# Patient Record
Sex: Female | Born: 1960 | Race: White | Hispanic: No | Marital: Married | Smoking: Never smoker
Health system: Southern US, Community
[De-identification: ages and names within clinical notes are randomized; demographics above are authoritative.]

## PROBLEM LIST (undated history)

## (undated) DIAGNOSIS — C439 Malignant melanoma of skin, unspecified: Secondary | ICD-10-CM

## (undated) DIAGNOSIS — Z460 Encounter for fitting and adjustment of spectacles and contact lenses: Secondary | ICD-10-CM

## (undated) HISTORY — DX: Malignant melanoma of skin, unspecified: C43.9

---

## 2007-03-03 HISTORY — PX: ABDOMINAL HYSTERECTOMY: SHX81

## 2007-03-15 ENCOUNTER — Encounter (INDEPENDENT_AMBULATORY_CARE_PROVIDER_SITE_OTHER): Payer: Self-pay | Admitting: Obstetrics & Gynecology

## 2007-03-16 ENCOUNTER — Inpatient Hospital Stay (HOSPITAL_COMMUNITY): Admission: AD | Admit: 2007-03-16 | Discharge: 2007-03-17 | Payer: Self-pay | Admitting: Obstetrics & Gynecology

## 2010-07-15 NOTE — Op Note (Signed)
[name], [name] NO.:  [id number]   MEDICAL RECORD NO.:  [id number]          PATIENT TYPE:  AMB   LOCATION:  DAY                          FACILITY:  [facility]   PHYSICIAN:  Genia Del, M.D.DATE OF BIRTH:  02-03-1961   DATE OF PROCEDURE:  03/15/2007  DATE OF DISCHARGE:                               OPERATIVE REPORT   PREOPERATIVE DIAGNOSIS:  Large symptomatic uterine myomas with  menorrhagia and anemia.   POSTOPERATIVE DIAGNOSIS:  Large symptomatic uterine myomas with  menorrhagia and anemia, bilateral paratubal cysts.   PROCEDURE:  Total laparoscopic hysterectomy and excision of paratubal  cysts bilaterally assisted with Davinci robot.   SURGEON:  Genia Del, M.D.   ASSISTANT:  Lendon Colonel, M.D.  Lenoard Aden, M.D.   ANESTHESIOLOGIST:  Jenelle Mages. Rica Mast, M.D.   PROCEDURE IN DETAIL:  Under general anesthesia with endotracheal  intubation, the patient is in lithotomy position.  She is prepped with  Betadine on the abdominal, suprapubic, vulvar, and vaginal areas and  draped as usual.  The vaginal exam reveals an anteverted uterus,  irregular, increased in volume, corresponding to about 14 cm.  No  adnexal masses felt.  We put the Coring with the Rumi in place as usual  without difficulty. A Rumi 10 cm is used.  We then go abdominally.  A  supraumbilical incision is made over 1.5 cm with a scalpel.  The  aponeurosis is opened under direct vision with Mayo scissors and the  parietal peritoneum is opened bluntly with a finger.  We put a  pursestring stitch of Vicryl 0 at the aponeurosis.  We insert the Ross  under direct vision.  We created pneumoperitoneum with CO2 and put the  camera at that level.  We inspect the abdominal pelvic cavities.  No  adhesion is present.  The uterus is large with multiple fibroids.  We  then use a W configuration for the robotic and assistant ports.  We put  them in under direct vision.  We then dock  the robot without difficulty  and insert the instruments which are the Endoshear scissors in the right  arm, the fenestrated bipolar in the left arm, and the third robotic arm  has a Cobra. We then go to the console and start the procedure.  We  cauterize and section the left round ligament, then the left tube and  left utero-ovarian ligament.  We then follow along the left uterine side  and stop just before the uterine artery.  We visualized the ureter on  the left side and it is in normal anatomic position.  We then opened the  visceral peritoneum anteriorly and reclined the bladder.  We proceed  exactly the same way on the right side.  Note, that the right ureter was  close to infundibulopelvic ligament, very superficially at the pelvic  brim. We then go on the left side and skeletonize the left uterine  artery, cauterized it, and sectioned it.  We proceed the same way on the  right side.  The uterus is blanching well.  We then further recline the  bladder downward.  We pushed the Coring further in and opened the vagina  anteriorly over the Coring with the tip of the scissors.  We continue  circumferentially until we reached each lateral side of the uterus.  We  go posteriorly to open the vagina over the Coring and then complete by  reaching the lateral right and then lateral left side.  The uterus is  completely detached.  Hemostasis is adequate at all levels.  We removed  the coring and the Rumi. We put the occluder and inflate it in the  vagina.  We put the uterus with all the fibroids towards the left  gutter. We changed the instruments to a cutting needle driver in the  right hand and normal needle driver in the left hand.  We use Vicryl 0  to close the vagina with figure-of-eights, seven of them are needed to  close the vagina completely and firmly. Hemostasis is adequate at all  levels.  We then removed two very small paratubal cyst on the right  side.  Those are discarded and we  removed a left paratubal cyst with the  distal parts of the left tube which had the hydrosalpinx. That specimen  is sent to pathology.  Hemostasis is adequate on the adnexa.  We then  remove all instruments.  We undocked the robot and continue  laparoscopically.  We removed the assistant port and put the morcellator  at that level.  We morcellate the uterus easily and send the specimen to  pathology.  The weight is 560 grams.  We then irrigate and suction the  abdominopelvic cavity.  We inspect carefully. Hemostasis is adequate at  all levels.  We remove all instruments and remove the trocars under  direct vision.  We evacuate the CO2.  The count of sponges and  instruments was complete x2.  We then close the pursestring stitch at  the supraumbilical incision by attaching it.  We then put a subcuticular  stitch of Vicryl 4-0 on all incisions and add Dermabond on top.  Hemostasis is adequate on all incisions.  The occluder was removed from  the vagina.  The patient had received a dose of Ancef 1 gram IV at  induction.  The estimated blood loss was 100 mL.  No complications  occurred and the patient was brought to the recovery room in good stable  status.      Genia Del, M.D.  Electronically Signed     ML/MEDQ  D:  03/15/2007  T:  03/15/2007  Job:  [id number]

## 2010-11-20 LAB — CBC
Hemoglobin: 9 — ABNORMAL LOW
MCHC: 32
MCV: 71.3 — ABNORMAL LOW
RBC: 3.79 — ABNORMAL LOW
RBC: 4.52
WBC: 15.5 — ABNORMAL HIGH
WBC: 6.3

## 2010-11-20 LAB — PREGNANCY, URINE

## 2012-03-02 HISTORY — PX: MELANOMA EXCISION: SHX5266

## 2012-08-31 ENCOUNTER — Other Ambulatory Visit: Payer: Self-pay | Admitting: Dermatology

## 2012-09-21 ENCOUNTER — Ambulatory Visit (INDEPENDENT_AMBULATORY_CARE_PROVIDER_SITE_OTHER): Payer: BC Managed Care – PPO | Admitting: General Surgery

## 2012-09-21 ENCOUNTER — Encounter (INDEPENDENT_AMBULATORY_CARE_PROVIDER_SITE_OTHER): Payer: Self-pay | Admitting: General Surgery

## 2012-09-21 VITALS — BP 108/78 | HR 53 | Temp 98.5°F | Ht 65.0 in | Wt 119.8 lb

## 2012-09-21 DIAGNOSIS — C4359 Malignant melanoma of other part of trunk: Secondary | ICD-10-CM | POA: Insufficient documentation

## 2012-09-21 NOTE — Patient Instructions (Signed)
Melanoma Melanoma is the least common, but most dangerous, form of skin cancer. This is because it can spread (metastasize) to other organs and can be life-threatening. Melanoma is a cancerous (malignant) tumor that begins in a certain type of cells, called melanocytes. Melanocytes are the cells that produce the color (pigment) called melanin. Melanin colors our skin, hair, eyes, and moles. CAUSES  The exact cause of melanoma is unknown. You may have a higher risk if you:  Spend or have spent a lot of time in the sun. This includes sunlamp and tanning booth exposure.  Have had sunburns. This put you at a particularly increased risk for melanoma. The more blistering sunburns a person has, the higher the risk.  Spend time in parts of the world with more intense sunlight.  Have fair skin that does not tan easily. You may have a lower risk if you have a darker skin color. However, people with darker skin can get melanoma, especially on the hands and feet (acral areas).  Have a close relative (parent, sibling) who has melanoma.  Have a large number of skin moles (more than 100). SYMPTOMS  A skin mole is suspicious if it has any of these 5 traits. This is called the ABCDE's of melanoma:  Asymmetry: Irregular shape, not simply round or oval.  Border: Edge of the mole is irregular, not smooth.  Color: Mole may have multiple colors in it, including brown, black, blue, red, or tan.  Diameter: More than 0.2 inches (6 mm) across.  Evolving: Any unusual change or symptoms in the mole, such as pain, itching, stinging, sensitivity, or bleeding. A mole that is noticeably changing in appearance, or any new mole, should be checked for melanoma. In general, people develop new moles until age 30. New moles after this age should be brought to the attention of your caregiver. DIAGNOSIS  Your caregiver can look at your skin and find lesions or moles that may be suspicious. A patient may also notice a mole  with symptoms or a mole that does not look like most of the other moles on his or her body. This is called the "ugly duckling" sign. A tissue sample (biopsy) examined under a microscope is needed to determine if it is melanoma. The size and extent of the biopsy will depend on the location, size, and appearance of the skin lesion or mole. The biopsy can also reveal whether melanoma has spread to deeper layers of the skin. TREATMENT  Surgery to completely remove the melanoma is required. Lymph nodes may also be removed. If the melanoma has spread to other organs, such as the liver, lungs, bone, or brain, cancer-fighting drugs (chemotherapy) must be used. Your caregiver will discuss your treatment options with you. You can ask about being included in a clinical trial to evaluate new forms of treatment. Melanoma can occasionally recur years after the initial diagnosis. If you have melanoma, you will need follow-up visits with your caregiver for many years. PREVENTION  Risk for melanoma can be reduced by minimizing sun exposure. Practice the 3 S's:  Slip on a shirt.  Slop on sunscreen.  Slap on a hat. Do not spend time in the sun during peak midafternoon hours. Sunscreen/sunblock with SPF 30 or higher and UVA/UVB block should be applied regularly. You should do this even during brief exposure to sunlight. You should also do this on cloudy days and in winter, even though the perceived sunlight is less. Always avoid sunburn! Wear sunglasses that block UV   light. Be sure to see your caregiver if you have any new or changing moles. HOME CARE INSTRUCTIONS   Follow wound care instructions after surgical removal of your melanoma.  Practice good sun avoidance and protective measures as described above.  Let your close family members (parents, children, siblings) know about your diagnosis. This puts them at a higher risk of getting melanoma than the general population. SEEK MEDICAL CARE IF:   You notice any  new moles, or you have any moles that are changing.  You have had a melanoma removed and you notice a new growth near the same location.  You have had a melanoma removed and you experience any new or unexplained health problems. Document Released: 02/16/2005 Document Revised: 05/11/2011 Document Reviewed: 06/07/2009 ExitCare Patient Information 2014 [employer].  

## 2012-09-21 NOTE — Progress Notes (Signed)
Patient ID: [name], female   DOB: February 08, 1961, 52 y.o.   MRN: [id number]  Chief Complaint  Patient presents with  . New Evaluation    eval melanoma of abd    HPI [name] is a 52 y.o. female.   HPI 52 year old Caucasian female referred by Dr. Arminda Resides for evaluation of a malignant melanoma involving the left upper abdominal wall. The patient states that she has had a small scar or 4 lesions were scabbed in that area ever since her laparoscopic assisted hysterectomy in 2009. She states that the area would scab over, peel-off and form a new scab. However about 4 months ago she states that the area started looking funny. She states that the scab fell off and it never formed a new scab. She states that it became dark, would bleed, and it had a change in size. She states that area also started to grow outward..  She underwent punch biopsy in Dr. Yetta Barre office on July 2  This is her first melanoma diagnosis. She has a history of basal cell carcinoma. There is no family history of melanoma. She states that she did have a fairly typical history of sun exposure including his tanning beds over the years.  History reviewed. No pertinent past medical history.  Past Surgical History  Procedure Laterality Date  . Abdominal hysterectomy  03/2007    partial    Family History  Problem Relation Age of Onset  . Cancer Maternal Grandfather     Liver    Social History History  Substance Use Topics  . Smoking status: Former Games developer  . Smokeless tobacco: Not on file  . Alcohol Use: Yes     Comment: very ra/ 4 times a year    No Known Allergies  No current outpatient prescriptions on file.   No current facility-administered medications for this visit.    Review of Systems Review of Systems  Constitutional: Negative for fever, chills, activity change, fatigue and unexpected weight change.  HENT: Negative for hearing loss, congestion, sore throat, trouble swallowing, neck  pain, voice change and sinus pressure.   Eyes: Negative for photophobia, pain and visual disturbance.  Respiratory: Negative for cough and wheezing.   Cardiovascular: Negative for chest pain, palpitations and leg swelling.  Gastrointestinal: Negative for nausea, vomiting, abdominal pain, diarrhea, constipation and blood in stool.  Genitourinary: Negative for hematuria, vaginal bleeding and difficulty urinating.  Musculoskeletal: Negative for arthralgias.  Skin: Negative for rash and wound.  Neurological: Negative for seizures, syncope, facial asymmetry, speech difficulty, weakness, numbness and headaches.       Remote h/o vertigo  Hematological: Negative for adenopathy. Does not bruise/bleed easily.  Psychiatric/Behavioral: Negative for confusion.    Blood pressure 108/78, pulse 53, temperature 98.5 F (36.9 C), temperature source Temporal, height 5\' 5"  (1.651 m), weight 119 lb 12.8 oz (54.341 kg), SpO2 98.00%.  Physical Exam Physical Exam  Vitals reviewed. Constitutional: She is oriented to person, place, and time. She appears well-developed and well-nourished. No distress.  HENT:  Head: Normocephalic and atraumatic.  Right Ear: External ear normal.  Left Ear: External ear normal.  Eyes: Conjunctivae are normal. No scleral icterus.  Neck: Normal range of motion. Neck supple. No tracheal deviation present. No thyromegaly present.  Cardiovascular: Normal rate and normal heart sounds.   Pulmonary/Chest: Effort normal and breath sounds normal. No stridor. No respiratory distress. She has no wheezes.  Abdominal: Soft. There is no tenderness. There is no rebound and no guarding.  1cm almost healed incision  Musculoskeletal: She exhibits no edema and no tenderness.  Lymphadenopathy:       Head (right side): No submental, no submandibular, no preauricular and no posterior auricular adenopathy present.       Head (left side): No submental, no submandibular, no preauricular and no  posterior auricular adenopathy present.    She has no cervical adenopathy.    She has no axillary adenopathy.       Right: No inguinal and no supraclavicular adenopathy present.       Left: No inguinal and no supraclavicular adenopathy present.  Neurological: She is alert and oriented to person, place, and time. She exhibits normal muscle tone.  Skin: Skin is warm and dry. No rash noted. She is not diaphoretic. No erythema.  Psychiatric: She has a normal mood and affect. Her behavior is normal. Judgment and thought content normal.      Data Reviewed Dr Yetta Barre office note - 11mm Crusted erythematous and hyperpigmented papule located on left upper abdomen. 6 mm punch biopsy was performed with placement of 4 5-0 nylon sutures  Pathology report-malignant melanoma, Clark's level IV, Breslow's measurement 2.75 mm, 12 mitoses per millimeter square, regression absent, ulceration present, satellitosis absent, vascular invasion not identified, margins involved; T3 B. 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 We discussed its evaluation and management. The patient was given educational material. She is accompanied by her husband.  We first discussed management of the primary lesion in her abdominal wall. I explained the need to do a wide local excision. We discussed the need to do 2 cm margins. We discussed that this would involve making a larger incision in order to close the skin.   We then discussed addressing potential lymph node involvement. We discussed sentinel lymph node biopsy. We discussed the manner and how it is performed. We discussed lymph node mapping preoperatively so we would know where to perform the sentinel lymph node biopsy. We discussed that while sentinel lymph node biopsy has not shown any improvement with respect to survival it does help with staging disease. We  discussed the risk and benefits of the procedure.   With respect to wide local excision of abdominal wall melanoma as well as sentinel lymph node biopsy we discussed the risk and benefits of surgery including but not limited to bleeding, infection, injury to surrounding structures, seroma, hematoma formation, blood clot formation, anesthesia concerns, cosmesis outcomes, lymph leak, lymphocele. We discussed most common issue is some type of wound healing problem. We discussed the typical postoperative course. We briefly discussed the potential for a full lymph node dissection should the sentinel lymph node be positive. However I told the patient I do not send the sentinel lymph node off during surgery.  She states that she is scheduled to followup her dermatologist to do a full body skin survey which I think is very appropriate.  All of her questions were asked and answered and she would like to proceed with surgery as described above. I instructed her and her husband to contact the office should she have any questions or concerns  Mary Sella. Andrey Campanile, MD, FACS General, Bariatric, & Minimally Invasive Surgery [facility], [place]        [facility] M 09/21/2012, 1:05 PM

## 2012-09-22 ENCOUNTER — Encounter (INDEPENDENT_AMBULATORY_CARE_PROVIDER_SITE_OTHER): Payer: Self-pay | Admitting: General Surgery

## 2012-09-22 ENCOUNTER — Telehealth (INDEPENDENT_AMBULATORY_CARE_PROVIDER_SITE_OTHER): Payer: Self-pay

## 2012-09-22 NOTE — Telephone Encounter (Signed)
Pt  Calling after reading her AVS, she is upset her name was [name]. Sprague and she does not do by that, her name was corrected to [name]. [name].  She states she is also upset because her AVS states she is a former smoker.  Pt states she has never been a "smoker" but did smoke cigarettes with her friends occasionally in her teens/ twenties.  Pt would like the AVS corrected to state she has never been a real "smoker".  I advised patient I would put a note in her chart regarding the smoking issue.  Patient satisfied.

## 2012-09-23 ENCOUNTER — Encounter (HOSPITAL_BASED_OUTPATIENT_CLINIC_OR_DEPARTMENT_OTHER): Payer: Self-pay | Admitting: *Deleted

## 2012-09-23 NOTE — Progress Notes (Signed)
No health problems -to come in for [facility] labs

## 2012-09-26 ENCOUNTER — Encounter (HOSPITAL_COMMUNITY)
Admission: RE | Admit: 2012-09-26 | Discharge: 2012-09-26 | Disposition: A | Discharge status: Home / Self Care | Payer: BC Managed Care – PPO | Source: Ambulatory Visit | Attending: General Surgery | Admitting: General Surgery

## 2012-09-26 DIAGNOSIS — C4359 Malignant melanoma of other part of trunk: Secondary | ICD-10-CM

## 2012-09-26 MED ORDER — TECHNETIUM TC 99M SULFUR COLLOID FILTERED
0.5000 | Freq: Once | INTRAVENOUS | Status: AC | PRN
  Administered 2012-09-26: 0.5 via INTRADERMAL

## 2012-09-27 ENCOUNTER — Telehealth (INDEPENDENT_AMBULATORY_CARE_PROVIDER_SITE_OTHER): Payer: Self-pay | Admitting: General Surgery

## 2012-09-27 NOTE — Telephone Encounter (Signed)
Pt is going to [facility] for her surgery

## 2012-09-29 ENCOUNTER — Encounter (HOSPITAL_COMMUNITY): Payer: BC Managed Care – PPO

## 2012-09-29 ENCOUNTER — Ambulatory Visit (HOSPITAL_BASED_OUTPATIENT_CLINIC_OR_DEPARTMENT_OTHER): Admission: RE | Admit: 2012-09-29 | Payer: BC Managed Care – PPO | Source: Ambulatory Visit | Admitting: General Surgery

## 2012-09-29 HISTORY — DX: Encounter for fitting and adjustment of spectacles and contact lenses: Z46.0

## 2012-09-29 SURGERY — EXCISION, MELANOMA, WITH SENTINEL LYMPH NODE BIOPSY
Anesthesia: General | Site: Abdomen

## 2012-10-20 ENCOUNTER — Encounter (INDEPENDENT_AMBULATORY_CARE_PROVIDER_SITE_OTHER): Payer: BC Managed Care – PPO | Admitting: General Surgery

## 2013-01-05 ENCOUNTER — Other Ambulatory Visit: Payer: Self-pay

## 2013-07-28 ENCOUNTER — Other Ambulatory Visit: Payer: Self-pay | Admitting: Dermatology

## 2013-08-28 ENCOUNTER — Other Ambulatory Visit: Payer: Self-pay | Admitting: Dermatology

## 2013-12-15 ENCOUNTER — Other Ambulatory Visit: Payer: Self-pay

## 2016-01-10 ENCOUNTER — Encounter (HOSPITAL_COMMUNITY): Payer: Self-pay | Admitting: Family Medicine

## 2016-01-10 ENCOUNTER — Ambulatory Visit (HOSPITAL_COMMUNITY)
Admission: EM | Admit: 2016-01-10 | Discharge: 2016-01-10 | Disposition: A | Discharge status: Home / Self Care | Payer: BLUE CROSS/BLUE SHIELD | Attending: Emergency Medicine | Admitting: Emergency Medicine

## 2016-01-10 DIAGNOSIS — R109 Unspecified abdominal pain: Secondary | ICD-10-CM

## 2016-01-10 DIAGNOSIS — R110 Nausea: Secondary | ICD-10-CM

## 2016-01-10 MED ORDER — METRONIDAZOLE 500 MG PO TABS: 500.0000 mg | ORAL_TABLET | Freq: Three times a day (TID) | ORAL | 0 refills | Status: DC

## 2016-01-10 MED ORDER — CIPROFLOXACIN HCL 500 MG PO TABS: 500.0000 mg | ORAL_TABLET | Freq: Two times a day (BID) | ORAL | 0 refills | Status: DC

## 2016-01-10 MED ORDER — ONDANSETRON HCL 4 MG PO TABS: 4.0000 mg | ORAL_TABLET | Freq: Four times a day (QID) | ORAL | 0 refills | Status: DC

## 2016-01-10 NOTE — Discharge Instructions (Signed)
°  Please take the antibiotics as prescribed.   There is a small chance of body aches and tendon injury with ciprofloxacin. If you develop severe muscle and joint pain while taking, please call your primary care provider or our urgent care so your medication can be changed.  While taking flagyl, do not drink alcohol as it will likely cause you to become nauseated and have severe vomiting.  If you develop fever, worsening pain, blood in stool, or unable to keep down fluids, please go to closest emergency department for further evaluation and treatment.  It is very important to still follow up with your primary care provider next week if possible, even if feeling better.

## 2016-01-10 NOTE — ED Provider Notes (Signed)
CSN: [id number]     Arrival date & time 01/10/16  1324 History   First MD Initiated Contact with Patient 01/10/16 1345     Chief Complaint  Patient presents with  . Abdominal Pain   (Consider location/radiation/quality/duration/timing/severity/associated sxs/prior Treatment) HPI [name] is a 55 y.o. female presenting to UC with c/o mild persistent nausea and Left sided abdominal discomfort and aching for about 2 weeks, worse after eating.  Pt notes symptoms started shortly after eating popcorn and drinking a diet soda.  She became nauseated and vomited once but pain and nausea have persisted.  She has tried to eat crackers and soup but everything seems to make the discomfort worse. Denies fever, chills, urinary symptoms or change in bowel movements. Denies diarrhea or blood in stool. No prior hx of diverticulitis and no known family hx of diverticulitis.  She has not had a colonoscopy yet as she knows she should see her PCP more often, however, is overall healthy so she does not go.   Past Medical History:  Diagnosis Date  . Contact lens/glasses fitting    wears glasses or contacts   Past Surgical History:  Procedure Laterality Date  . ABDOMINAL HYSTERECTOMY  03/2007   partial   Family History  Problem Relation Age of Onset  . Cancer Maternal Grandfather     Liver   Social History  Substance Use Topics  . Smoking status: Never Smoker  . Smokeless tobacco: Never Used     Comment: pt has very remote history of social smoking more than 30 yrs ago  . Alcohol use Yes     Comment: very ra/ 4 times a year   OB History    No data available     Review of Systems  Constitutional: Negative for chills and fever.  Gastrointestinal: Positive for abdominal pain, nausea and vomiting. Negative for blood in stool, constipation and diarrhea.  Genitourinary: Negative for dysuria, frequency, hematuria, pelvic pain, urgency, vaginal bleeding, vaginal discharge and vaginal pain.   Musculoskeletal: Negative for back pain and myalgias.    Allergies  Patient has no known allergies.  Home Medications   Prior to Admission medications   Medication Sig Start Date End Date Taking? Authorizing Provider  ciprofloxacin (CIPRO) 500 MG tablet Take 1 tablet (500 mg total) by mouth every 12 (twelve) hours. 01/10/16   Noland Fordyce, PA-C  metroNIDAZOLE (FLAGYL) 500 MG tablet Take 1 tablet (500 mg total) by mouth 3 (three) times daily. One po bid x 7 days 01/10/16   Noland Fordyce, PA-C  ondansetron (ZOFRAN) 4 MG tablet Take 1 tablet (4 mg total) by mouth every 6 (six) hours. 01/10/16   Noland Fordyce, PA-C   Meds Ordered and Administered this Visit  Medications - No data to display  BP 133/82 (BP Location: Left Arm)   Pulse (!) 52 Comment: notified rn  Temp 97.8 F (36.6 C) (Oral)   Resp 16   SpO2 99%  No data found.   Physical Exam  Constitutional: She appears well-developed and well-nourished. No distress.  Pt sitting on exam bed, appears well, NAD. Non-toxic appearing.  HENT:  Head: Normocephalic and atraumatic.  Mouth/Throat: Oropharynx is clear and moist.  Eyes: Conjunctivae are normal. No scleral icterus.  Neck: Normal range of motion. Neck supple.  Cardiovascular: Normal rate, regular rhythm and normal heart sounds.   Pulmonary/Chest: Effort normal and breath sounds normal. No respiratory distress. She has no wheezes. She has no rales.  Abdominal: Soft. Bowel sounds are  normal. She exhibits no distension and no mass. There is tenderness. There is no rebound, no guarding and no CVA tenderness.  Soft, non-distended, minimal tenderness in Left side of abdomen. No rebound, guarding, or masses palpated. No CVA tenderness.   Musculoskeletal: Normal range of motion.  Neurological: She is alert.  Skin: Skin is warm and dry. She is not diaphoretic.  Nursing note and vitals reviewed.   Urgent Care Course   Clinical Course     Procedures (including critical care  time)  Labs Review Labs Reviewed - No data to display  Imaging Review No results found.    MDM   1. Left sided abdominal pain   2. Nausea    Pt c/o persistent Left sided abdominal pain and nausea for about 2 weeks. Mild Left sided abdominal tenderness. Hx and exam c/w diverticulitis, however, pt does not have prior hx of diverticulitis.  Pt is afebrile, non-toxic appearing. Able to keep down fluids. Consulted with Dr. Bridgett Larsson, agrees pt can be treated empirically for diverticulitis, and have pt f/u with PCP and GI to schedule colonoscopy as she is due for one given her age anyway.    Rx: Cipro, flagyl, and zofran. Home care instructions for abdominal pain and diverticulitis. Encouraged to f/u with PCP next week. Discussed symptoms that warrant emergent care in the ED including worsening pain, fever, blood in stool or other new/concerning symptoms develop. Patient verbalized understanding and agreement with treatment plan.     Noland Fordyce, PA-C 01/10/16 1416

## 2016-01-10 NOTE — ED Triage Notes (Signed)
Pt here for left sided abd pain. sts that she ate some popcorn and diet coke 2 weeks ago and had abd pain right after with vomiting and diarrhea. sts none since that first day but lingering and  pain and nausea. sts she hasnt been going to the bathroom a lot but she isn't constipated.no urinary complaints.

## 2016-01-13 ENCOUNTER — Encounter: Payer: Self-pay | Admitting: Gastroenterology

## 2016-03-11 ENCOUNTER — Encounter: Payer: Self-pay | Admitting: Gastroenterology

## 2016-03-11 ENCOUNTER — Ambulatory Visit (INDEPENDENT_AMBULATORY_CARE_PROVIDER_SITE_OTHER): Payer: Self-pay | Admitting: Gastroenterology

## 2016-03-11 VITALS — BP 112/74 | HR 68 | Ht 64.5 in | Wt 114.0 lb

## 2016-03-11 DIAGNOSIS — R1032 Left lower quadrant pain: Secondary | ICD-10-CM

## 2016-03-11 DIAGNOSIS — Z1211 Encounter for screening for malignant neoplasm of colon: Secondary | ICD-10-CM

## 2016-03-11 MED ORDER — NA SULFATE-K SULFATE-MG SULF 17.5-3.13-1.6 GM/177ML PO SOLN: ORAL | 0 refills | Status: DC

## 2016-03-11 NOTE — Progress Notes (Signed)
HPI: This is a very pleasant 56 year old woman  who was referred to me by [facility] M.D.  Chief complaint is left lower abdominal Pain, presumed diverticulitis  She has chronic left lower quadrant discomfort. This is been going on for many many years. She thinks it's worse when she drinks carbonated beverages. About 2 months ago she had worsening of that left lower quadrant pain, pain became significant enough that she sought medical attention at urgent clinic.   She went to urgent clinic November 2017 with left-sided abdominal pains, nausea. She was found to have minimal tenderness in the left side. She was treated empirically for diverticulitis with oral antibiotics Cipro and Flagyl. She did not have any lab testing or imaging studies.  She completed the antibiotic course and  She is back to her nomral. She usually has llq discomfort.  She drinks a lot of diet coke and chocolate, cut those out for 8 weeks.  Reduced the meat and gluten she eats but back.  She is Emergency planning/management officer.  1-2bms daily.  No bleeding  Overall stable weight  No FH of colon cancer.   Review of systems: Pertinent positive and negative review of systems were noted in the above HPI section. Complete review of systems was performed and was otherwise normal.   Past Medical History:  Diagnosis Date  . Contact lens/glasses fitting    wears glasses or contacts  . Melanoma [facility])     Past Surgical History:  Procedure Laterality Date  . ABDOMINAL HYSTERECTOMY  03/2007   partial  . MELANOMA EXCISION  2014   abdominal    No current outpatient prescriptions on file.   No current facility-administered medications for this visit.     Allergies as of 03/11/2016  . (No Known Allergies)    Family History  Problem Relation Age of Onset  . Hypertension Mother   . Liver cancer Maternal Grandfather   . Breast cancer Sister   . Hypertension Brother   . Cancer Maternal Grandmother     type unknown  .  Heart disease Maternal Grandmother   . Heart disease Paternal Grandfather     Social History   Social History  . Marital status: Married    Spouse name: N/A  . Number of children: 2  . Years of education: N/A   Occupational History  . parellegal    Social History Main Topics  . Smoking status: Never Smoker  . Smokeless tobacco: Never Used     Comment: pt has very remote history of social smoking more than 30 yrs ago  . Alcohol use Yes     Comment: very ra/ 4 times a year  . Drug use: No  . Sexual activity: Not on file   Other Topics Concern  . Not on file   Social History Narrative  . No narrative on file     Physical Exam: Ht 5' 4.5" (1.638 m) Comment: height measured without shoes  Wt 114 lb (51.7 kg)   BMI 19.27 kg/m  Constitutional: generally well-appearing Psychiatric: alert and oriented x3 Eyes: extraocular movements intact Mouth: oral pharynx moist, no lesions Neck: supple no lymphadenopathy Cardiovascular: heart regular rate and rhythm Lungs: clear to auscultation bilaterally Abdomen: soft, Very mild left lower quadrant pain, nondistended, no obvious ascites, no peritoneal signs, normal bowel sounds Extremities: no lower extremity edema bilaterally Skin: no lesions on visible extremities   Assessment and plan: 56 y.o. female with  previous presumed left-sided diverticulitis, routine risk for  colon cancer screening  I do think that she probably had mild left-sided colonic diverticulitis back in November. She has recovered and is back to her baseline after a single course of oral antibiotics. No imaging or blood tests were done during her acute illness and so the diagnosis is not completely confident. She is 62 and she has never had colon cancer screening I recommended we proceed with colonoscopy at her soonest convenience. She also knows to call here if she has repeated episodes of left-sided acute abdominal pains. If that is the case I would like to proceed  with blood tests and CT scan abdomen and pelvis to try to confirm her diagnosis.   Owens Loffler, MD [facility] 03/11/2016, 8:41 AM  Cc: Princess Bruins, MD

## 2016-03-11 NOTE — Patient Instructions (Addendum)
You have been scheduled for a colonoscopy. Please follow written instructions given to you at your visit today.  Please pick up your prep supplies at the pharmacy within the next 1-3 days. If you use inhalers (even only as needed), please bring them with you on the day of your procedure. Your physician has requested that you go to www.startemmi.com and enter the access code given to you at your visit today. This web site gives a general overview about your procedure. However, you should still follow specific instructions given to you by our office regarding your preparation for the procedure.  Please call here with repeat episodes of left lower quadrant abdominal pain.  Your body mass index should be between 19-25. Your Body mass index is 19.27 kg/m. If this is out of the aformentioned range listed, please consider follow up with your Primary Care Provider.

## 2016-03-12 ENCOUNTER — Encounter: Payer: Self-pay | Admitting: Gastroenterology

## 2016-03-25 ENCOUNTER — Encounter: Payer: Self-pay | Admitting: Gastroenterology

## 2016-03-25 ENCOUNTER — Ambulatory Visit (AMBULATORY_SURGERY_CENTER): Payer: BLUE CROSS/BLUE SHIELD | Admitting: Gastroenterology

## 2016-03-25 VITALS — BP 108/60 | HR 41 | Temp 97.8°F | Resp 16 | Ht 64.0 in | Wt 114.0 lb

## 2016-03-25 DIAGNOSIS — Z1212 Encounter for screening for malignant neoplasm of rectum: Secondary | ICD-10-CM | POA: Diagnosis not present

## 2016-03-25 DIAGNOSIS — K5730 Diverticulosis of large intestine without perforation or abscess without bleeding: Secondary | ICD-10-CM

## 2016-03-25 DIAGNOSIS — R1032 Left lower quadrant pain: Secondary | ICD-10-CM

## 2016-03-25 DIAGNOSIS — Z1211 Encounter for screening for malignant neoplasm of colon: Secondary | ICD-10-CM

## 2016-03-25 MED ORDER — SODIUM CHLORIDE 0.9 % IV SOLN: 500.0000 mL | INTRAVENOUS | Status: DC

## 2016-03-25 NOTE — Patient Instructions (Signed)
YOU HAD AN ENDOSCOPIC PROCEDURE TODAY AT [facility]:   Refer to the procedure report that was given to you for any specific questions about what was found during the examination.  If the procedure report does not answer your questions, please call your gastroenterologist to clarify.  If you requested that your care partner not be given the details of your procedure findings, then the procedure report has been included in a sealed envelope for you to review at your convenience later.  YOU SHOULD EXPECT: Some feelings of bloating in the abdomen. Passage of more gas than usual.  Walking can help get rid of the air that was put into your GI tract during the procedure and reduce the bloating. If you had a lower endoscopy (such as a colonoscopy or flexible sigmoidoscopy) you may notice spotting of blood in your stool or on the toilet paper. If you underwent a bowel prep for your procedure, you may not have a normal bowel movement for a few days.  Please Note:  You might notice some irritation and congestion in your nose or some drainage.  This is from the oxygen used during your procedure.  There is no need for concern and it should clear up in a day or so.  SYMPTOMS TO REPORT IMMEDIATELY:   Following lower endoscopy (colonoscopy or flexible sigmoidoscopy):  Excessive amounts of blood in the stool  Significant tenderness or worsening of abdominal pains  Swelling of the abdomen that is new, acute  Fever of 100F or higher  For urgent or emergent issues, a gastroenterologist can be reached at any hour by calling [phone].   DIET:  We do recommend a small meal at first, but then you may proceed to your regular diet.  Drink plenty of fluids but you should avoid alcoholic beverages for 24 hours.  ACTIVITY:  You should plan to take it easy for the rest of today and you should NOT DRIVE or use heavy machinery until tomorrow (because of the sedation medicines used during the test).     FOLLOW UP: Our staff will call the number listed on your records the next business day following your procedure to check on you and address any questions or concerns that you may have regarding the information given to you following your procedure. If we do not reach you, we will leave a message.  However, if you are feeling well and you are not experiencing any problems, there is no need to return our call.  We will assume that you have returned to your regular daily activities without incident.  If any biopsies were taken you will be contacted by phone or by letter within the next 1-3 weeks.  Please call us at [phone] if you have not heard about the biopsies in 3 weeks.    SIGNATURES/CONFIDENTIALITY: You and/or your care partner have signed paperwork which will be entered into your electronic medical record.  These signatures attest to the fact that that the information above on your After Visit Summary has been reviewed and is understood.  Full responsibility of the confidentiality of this discharge information lies with you and/or your care-partner.  Resume medications. Information given on diverticulosis and high fiber diet.

## 2016-03-25 NOTE — Progress Notes (Signed)
A and O x3. Report to RN. Tolerated MAC anesthesia well.

## 2016-03-25 NOTE — Op Note (Addendum)
[name] Patient Name: [name] Procedure Date: 03/25/2016 9:46 AM MRN: [id number] Endoscopist: Milus Banister , MD Age: 56 Referring MD:  Date of Birth: Jan 21, 1961 Gender: Female Account #: [id number] Procedure:                Colonoscopy Indications:              Screening for colorectal malignant neoplasm Medicines:                Monitored Anesthesia Care Procedure:                Pre-Anesthesia Assessment:                           - Prior to the procedure, a History and Physical                            was performed, and patient medications and                            allergies were reviewed. The patient's tolerance of                            previous anesthesia was also reviewed. The risks                            and benefits of the procedure and the sedation                            options and risks were discussed with the patient.                            All questions were answered, and informed consent                            was obtained. Prior Anticoagulants: The patient has                            taken no previous anticoagulant or antiplatelet                            agents. ASA Grade Assessment: II - A patient with                            mild systemic disease. After reviewing the risks                            and benefits, the patient was deemed in                            satisfactory condition to undergo the procedure.                           After obtaining informed consent, the colonoscope  was passed under direct vision. Throughout the                            procedure, the patient's blood pressure, pulse, and                            oxygen saturations were monitored continuously. The                            Model CF-HQ190L [id number]) scope was introduced                            through the anus and advanced to the the cecum,                            identified by  appendiceal orifice and ileocecal                            valve. The colonoscopy was performed without                            difficulty. The patient tolerated the procedure                            well. The quality of the bowel preparation was                            good. The ileocecal valve, appendiceal orifice, and                            rectum were photographed. Scope In: 9:49:48 AM Scope Out: 10:02:43 AM Scope Withdrawal Time: 0 hours 7 minutes 11 seconds  Total Procedure Duration: 0 hours 12 minutes 55 seconds  Findings:                 Very mild diverticulosis in left colon.                           The examination was otherwise normal. Complications:            No immediate complications. Estimated blood loss:                            None. Estimated Blood Loss:     Estimated blood loss: none. Impression:               - Mild diverticulosis, otherwise normal examination.                           - No polyps or cancers. Recommendation:           - Patient has a contact number available for                            emergencies. The signs and symptoms of potential  delayed complications were discussed with the                            patient. Return to normal activities tomorrow.                            Written discharge instructions were provided to the                            patient.                           - Resume previous diet.                           - Continue present medications.                           - Repeat colonoscopy in 10 years for screening                            purposes. Milus Banister, MD 03/25/2016 10:06:03 AM This report has been signed electronically.

## 2016-03-26 ENCOUNTER — Telehealth: Payer: Self-pay | Admitting: *Deleted

## 2016-03-26 NOTE — Telephone Encounter (Signed)
  Follow up Call-  Call back number 03/25/2016  Post procedure Call Back phone  # [phone]  Permission to leave phone message Yes  Some recent data might be hidden     Patient questions:  Do you have a fever, pain , or abdominal swelling? No. Pain Score  0 *  Have you tolerated food without any problems? Yes.    Have you been able to return to your normal activities? Yes.    Do you have any questions about your discharge instructions: Diet   No. Medications  No. Follow up visit  No.  Do you have questions or concerns about your Care? No.  Actions: * If pain score is 4 or above: No action needed, pain <4.

## 2016-04-01 ENCOUNTER — Encounter: Payer: Self-pay | Admitting: Gastroenterology

## 2016-07-01 ENCOUNTER — Ambulatory Visit (INDEPENDENT_AMBULATORY_CARE_PROVIDER_SITE_OTHER): Payer: BLUE CROSS/BLUE SHIELD | Admitting: Orthopedic Surgery

## 2016-07-01 ENCOUNTER — Ambulatory Visit (INDEPENDENT_AMBULATORY_CARE_PROVIDER_SITE_OTHER): Payer: BLUE CROSS/BLUE SHIELD

## 2016-07-01 VITALS — Ht 64.0 in | Wt 114.0 lb

## 2016-07-01 DIAGNOSIS — M79642 Pain in left hand: Secondary | ICD-10-CM

## 2016-07-01 DIAGNOSIS — M1811 Unilateral primary osteoarthritis of first carpometacarpal joint, right hand: Secondary | ICD-10-CM | POA: Diagnosis not present

## 2016-07-01 NOTE — Progress Notes (Signed)
Office Visit Note   Patient: [name]           Date of Birth: May 23, 1960           MRN: [id number] Visit Date: 07/01/2016              Requested by: Princess Bruins, MD [address] [place], [place] [address] PCP: Princess Bruins, MD  Chief Complaint  Patient presents with  . Left Hand - Pain      HPI: Patient presents complaining of a 5 week history of pain carpometacarpal joint left thumb. Patient is right-hand dominant. She is left-hand to play tennis. She states with any type of grip strength she has increased pain. She has tried acupuncture 3 visits without relief. She does a lot of typing with her left hand.  Assessment & Plan: Visit Diagnoses:  1. Left hand pain   2. Primary osteoarthritis of first carpometacarpal joint of right hand     Plan: Recommended paraffin heat baths she will continue with Aleve 2-3 by mouth twice a day when necessary pain. Discussed that we could proceed with a steroid injection if she would want the injection. She wants to hold on injection at this time.  Follow-Up Instructions: Return if symptoms worsen or fail to improve.   Ortho Exam  Patient is alert, oriented, no adenopathy, well-dressed, normal affect, normal respiratory effort. Examination she does have increased laxity of the carpometacarpal joint bilaterally but there is no difference she is tender to palpation at the carpometacarpal joint left hand. The first dorsal extensor compartment is nontender to palpation Finkelstein's test is negative.  Imaging: Xr Hand Complete Left  Result Date: 07/01/2016 Three-view radiographs of the left hand shows laxity of the carpal metacarpal joint left thumb. No cystic changes no bony spurs no dislocation.   Labs: No results found for: HGBA1C, ESRSEDRATE, CRP, LABURIC, REPTSTATUS, GRAMSTAIN, CULT, LABORGA  Orders:  Orders Placed This Encounter  Procedures  . XR Hand Complete Left   No orders of the defined types  were placed in this encounter.    Procedures: No procedures performed  Clinical Data: No additional findings.  ROS:  All other systems negative, except as noted in the HPI. Review of Systems  Objective: Vital Signs: Ht 5\' 4"  (1.626 m)   Wt 114 lb (51.7 kg)   BMI 19.57 kg/m   Specialty Comments:  No specialty comments available.  PMFS History: Patient Active Problem List   Diagnosis Date Noted  . Primary osteoarthritis of first carpometacarpal joint of right hand 07/01/2016  . Melanoma of abdominal wall ([facility]) 09/21/2012   Past Medical History:  Diagnosis Date  . Contact lens/glasses fitting    wears glasses or contacts  . Melanoma ([facility])     Family History  Problem Relation Age of Onset  . Hypertension Mother   . Liver cancer Maternal Grandfather   . Breast cancer Sister   . Hypertension Brother   . Cancer Maternal Grandmother     type unknown  . Heart disease Maternal Grandmother   . Heart disease Paternal Grandfather     Past Surgical History:  Procedure Laterality Date  . ABDOMINAL HYSTERECTOMY  03/2007   partial  . MELANOMA EXCISION  2014   abdominal   Social History   Occupational History  . [employer]    Social History Main Topics  . Smoking status: Never Smoker  . Smokeless tobacco: Never Used     Comment: pt has very remote  history of social smoking more than 30 yrs ago  . Alcohol use Yes     Comment: very ra/ 4 times a year  . Drug use: No  . Sexual activity: Not on file

## 2016-11-24 ENCOUNTER — Encounter: Payer: Self-pay | Admitting: Obstetrics & Gynecology

## 2016-12-03 ENCOUNTER — Telehealth: Payer: Self-pay | Admitting: Gastroenterology

## 2016-12-03 NOTE — Telephone Encounter (Signed)
Good Morning [name]!  Can you help me with this patient? MRN# [id number], [name].  Patient husband [phone]) called in stating that Dr. Ardis Hughs told patient that her colonoscopy was going to be preventative. Marya Amsler is wanting to know how colon was coded? If colon was diagnostic, he is wanting to know why. --Harlon Ditty would you please call and let patient know we are working on correcting procedure report and will refile ASAP. Thanks, Barb  [employer] Patient husband is telling me that Dr. Ardis Hughs also told patient that 04-09-2016 OV would be coded "preventative". Can you look into this as well? Thank You so much!--Stephanie

## 2019-05-26 ENCOUNTER — Ambulatory Visit: Payer: Self-pay

## 2020-04-29 DIAGNOSIS — Z85828 Personal history of other malignant neoplasm of skin: Secondary | ICD-10-CM | POA: Diagnosis not present

## 2020-04-29 DIAGNOSIS — L812 Freckles: Secondary | ICD-10-CM | POA: Diagnosis not present

## 2020-04-29 DIAGNOSIS — Z85820 Personal history of malignant melanoma of skin: Secondary | ICD-10-CM | POA: Diagnosis not present

## 2020-04-29 DIAGNOSIS — L821 Other seborrheic keratosis: Secondary | ICD-10-CM | POA: Diagnosis not present

## 2020-05-23 DIAGNOSIS — D0439 Carcinoma in situ of skin of other parts of face: Secondary | ICD-10-CM | POA: Diagnosis not present

## 2020-10-16 DIAGNOSIS — Z85820 Personal history of malignant melanoma of skin: Secondary | ICD-10-CM | POA: Diagnosis not present

## 2020-10-16 DIAGNOSIS — L814 Other melanin hyperpigmentation: Secondary | ICD-10-CM | POA: Diagnosis not present

## 2020-10-16 DIAGNOSIS — Z85828 Personal history of other malignant neoplasm of skin: Secondary | ICD-10-CM | POA: Diagnosis not present

## 2020-10-16 DIAGNOSIS — L570 Actinic keratosis: Secondary | ICD-10-CM | POA: Diagnosis not present

## 2020-12-30 DIAGNOSIS — Z23 Encounter for immunization: Secondary | ICD-10-CM | POA: Diagnosis not present

## 2020-12-31 DIAGNOSIS — H43812 Vitreous degeneration, left eye: Secondary | ICD-10-CM | POA: Diagnosis not present

## 2020-12-31 DIAGNOSIS — H31092 Other chorioretinal scars, left eye: Secondary | ICD-10-CM | POA: Diagnosis not present

## 2021-02-11 DIAGNOSIS — H43812 Vitreous degeneration, left eye: Secondary | ICD-10-CM | POA: Diagnosis not present

## 2021-02-11 DIAGNOSIS — H31092 Other chorioretinal scars, left eye: Secondary | ICD-10-CM | POA: Diagnosis not present

## 2021-06-10 ENCOUNTER — Telehealth: Payer: Self-pay | Admitting: Gastroenterology

## 2021-06-10 ENCOUNTER — Encounter: Payer: Self-pay | Admitting: Physician Assistant

## 2021-06-10 NOTE — Telephone Encounter (Signed)
Patient called complaining of having stomach pain for the last month 24x7 regardless of whether she eats or drinks.  The pain is from her sternum to down below her belly button then spreads out to each side.  She said it is not heartburn as she's tried several OTC meds that have provided no help. She has an appointment with an app 4/27, but feels she needs to be seen sooner if at all possible.  Please call patient and advise.  Thank you. ?

## 2021-06-10 NOTE — Telephone Encounter (Signed)
Patient returned your call, please advise. 

## 2021-06-10 NOTE — Telephone Encounter (Signed)
Left message on machine to call back  

## 2021-06-10 NOTE — Telephone Encounter (Signed)
The pt has complaints of abd pain x 1 month- she does not notice any correlation with food or drink.  She has an appt with [name] on 4/27.  She will keep that appt and call to see if we have any cancellations prior to that appt.  She has not been seen here since 2018.  She was advised that she can also call her PCP in the meantime for possible recommendations.   ?

## 2021-06-12 DIAGNOSIS — R1013 Epigastric pain: Secondary | ICD-10-CM | POA: Diagnosis not present

## 2021-06-16 ENCOUNTER — Emergency Department (HOSPITAL_BASED_OUTPATIENT_CLINIC_OR_DEPARTMENT_OTHER): Payer: BC Managed Care – PPO

## 2021-06-16 ENCOUNTER — Inpatient Hospital Stay (HOSPITAL_BASED_OUTPATIENT_CLINIC_OR_DEPARTMENT_OTHER)
Admission: EM | Admit: 2021-06-16 | Discharge: 2021-06-20 | DRG: 327 | Disposition: A | Discharge status: Home / Self Care | Payer: BC Managed Care – PPO | Attending: General Surgery | Admitting: General Surgery

## 2021-06-16 ENCOUNTER — Other Ambulatory Visit: Payer: Self-pay

## 2021-06-16 ENCOUNTER — Encounter (HOSPITAL_BASED_OUTPATIENT_CLINIC_OR_DEPARTMENT_OTHER): Payer: Self-pay

## 2021-06-16 DIAGNOSIS — Z85820 Personal history of malignant melanoma of skin: Secondary | ICD-10-CM | POA: Diagnosis not present

## 2021-06-16 DIAGNOSIS — C784 Secondary malignant neoplasm of small intestine: Secondary | ICD-10-CM | POA: Diagnosis not present

## 2021-06-16 DIAGNOSIS — C7889 Secondary malignant neoplasm of other digestive organs: Secondary | ICD-10-CM | POA: Diagnosis not present

## 2021-06-16 DIAGNOSIS — R9389 Abnormal findings on diagnostic imaging of other specified body structures: Secondary | ICD-10-CM

## 2021-06-16 DIAGNOSIS — C4359 Malignant melanoma of other part of trunk: Secondary | ICD-10-CM | POA: Diagnosis not present

## 2021-06-16 DIAGNOSIS — Z8249 Family history of ischemic heart disease and other diseases of the circulatory system: Secondary | ICD-10-CM | POA: Diagnosis not present

## 2021-06-16 DIAGNOSIS — Z800 Family history of malignant neoplasm of digestive organs: Secondary | ICD-10-CM | POA: Diagnosis not present

## 2021-06-16 DIAGNOSIS — K56609 Unspecified intestinal obstruction, unspecified as to partial versus complete obstruction: Secondary | ICD-10-CM | POA: Diagnosis not present

## 2021-06-16 DIAGNOSIS — C439 Malignant melanoma of skin, unspecified: Secondary | ICD-10-CM | POA: Diagnosis not present

## 2021-06-16 DIAGNOSIS — K561 Intussusception: Secondary | ICD-10-CM | POA: Diagnosis not present

## 2021-06-16 DIAGNOSIS — R918 Other nonspecific abnormal finding of lung field: Secondary | ICD-10-CM | POA: Diagnosis present

## 2021-06-16 DIAGNOSIS — R109 Unspecified abdominal pain: Secondary | ICD-10-CM | POA: Diagnosis not present

## 2021-06-16 DIAGNOSIS — Z803 Family history of malignant neoplasm of breast: Secondary | ICD-10-CM

## 2021-06-16 DIAGNOSIS — K5669 Other partial intestinal obstruction: Secondary | ICD-10-CM | POA: Diagnosis not present

## 2021-06-16 DIAGNOSIS — K633 Ulcer of intestine: Secondary | ICD-10-CM | POA: Diagnosis not present

## 2021-06-16 LAB — COMPREHENSIVE METABOLIC PANEL
ALT: 22 U/L (ref 0–44)
AST: 28 U/L (ref 15–41)
Albumin: 4.8 g/dL (ref 3.5–5.0)
Alkaline Phosphatase: 77 U/L (ref 38–126)
Anion gap: 11 (ref 5–15)
BUN: 13 mg/dL (ref 8–23)
CO2: 27 mmol/L (ref 22–32)
Calcium: 10.5 mg/dL — ABNORMAL HIGH (ref 8.9–10.3)
Chloride: 101 mmol/L (ref 98–111)
Creatinine, Ser: 0.97 mg/dL (ref 0.44–1.00)
GFR, Estimated: >60 mL/min (ref >60)
Glucose, Bld: 101 mg/dL — ABNORMAL HIGH (ref 70–99)
Potassium: 3.7 mmol/L (ref 3.5–5.1)
Sodium: 139 mmol/L (ref 135–145)
Total Bilirubin: 0.7 mg/dL (ref 0.3–1.2)
Total Protein: 7.6 g/dL (ref 6.5–8.1)

## 2021-06-16 LAB — CBC
HCT: 42.4 % (ref 36.0–46.0)
Hemoglobin: 14.2 g/dL (ref 12.0–15.0)
MCH: 30 pg (ref 26.0–34.0)
MCHC: 33.5 g/dL (ref 30.0–36.0)
MCV: 89.6 fL (ref 80.0–100.0)
Platelets: 335 10*3/uL (ref 150–400)
RBC: 4.73 MIL/uL (ref 3.87–5.11)
RDW: 12.7 % (ref 11.5–15.5)
WBC: 7.6 10*3/uL (ref 4.0–10.5)
nRBC: 0 % (ref 0.0–0.2)

## 2021-06-16 LAB — URINALYSIS, ROUTINE W REFLEX MICROSCOPIC
Bilirubin Urine: NEGATIVE
Glucose, UA: NEGATIVE mg/dL
Hgb urine dipstick: NEGATIVE
Ketones, ur: 40 mg/dL — AB
Nitrite: NEGATIVE
Protein, ur: 30 mg/dL — AB
Specific Gravity, Urine: 1.03 (ref 1.005–1.030)
pH: 5.5 (ref 5.0–8.0)

## 2021-06-16 LAB — LIPASE, BLOOD: Lipase: 28 U/L (ref 11–51)

## 2021-06-16 MED ORDER — LACTATED RINGERS IV SOLN
INTRAVENOUS | Status: AC
  Administered 2021-06-16: 1000 mL via INTRAVENOUS

## 2021-06-16 MED ORDER — MORPHINE SULFATE (PF) 2 MG/ML IV SOLN
2.0000 mg | INTRAVENOUS | Status: DC | PRN
  Administered 2021-06-16 – 2021-06-18 (×7): 2 mg via INTRAVENOUS
  Filled 2021-06-16 (×7): qty 1

## 2021-06-16 MED ORDER — ONDANSETRON HCL 4 MG/2ML IJ SOLN
4.0000 mg | Freq: Four times a day (QID) | INTRAMUSCULAR | Status: DC | PRN
  Administered 2021-06-16 – 2021-06-20 (×4): 4 mg via INTRAVENOUS
  Filled 2021-06-16 (×4): qty 2

## 2021-06-16 MED ORDER — ONDANSETRON 4 MG PO TBDP: 4.0000 mg | ORAL_TABLET | Freq: Four times a day (QID) | ORAL | Status: DC | PRN

## 2021-06-16 MED ORDER — SODIUM CHLORIDE 0.9 % IV BOLUS
500.0000 mL | Freq: Once | INTRAVENOUS | Status: AC
  Administered 2021-06-16: 500 mL via INTRAVENOUS

## 2021-06-16 MED ORDER — ALUM & MAG HYDROXIDE-SIMETH 200-200-20 MG/5ML PO SUSP
30.0000 mL | Freq: Once | ORAL | Status: AC
  Administered 2021-06-16: 30 mL via ORAL
  Filled 2021-06-16: qty 30

## 2021-06-16 MED ORDER — ENOXAPARIN SODIUM 40 MG/0.4ML IJ SOSY
40.0000 mg | PREFILLED_SYRINGE | INTRAMUSCULAR | Status: DC
  Administered 2021-06-16 – 2021-06-19 (×4): 40 mg via SUBCUTANEOUS
  Filled 2021-06-16 (×4): qty 0.4

## 2021-06-16 MED ORDER — DICYCLOMINE HCL 10 MG PO CAPS
10.0000 mg | ORAL_CAPSULE | Freq: Once | ORAL | Status: AC
Start: 2021-06-16 — End: 2021-06-16
  Administered 2021-06-16: 10 mg via ORAL
  Filled 2021-06-16: qty 1

## 2021-06-16 MED ORDER — IOHEXOL 300 MG/ML  SOLN
100.0000 mL | Freq: Once | INTRAMUSCULAR | Status: AC | PRN
  Administered 2021-06-16: 75 mL via INTRAVENOUS

## 2021-06-16 MED ORDER — DIPHENHYDRAMINE HCL 12.5 MG/5ML PO ELIX
12.5000 mg | ORAL_SOLUTION | Freq: Four times a day (QID) | ORAL | Status: DC | PRN
Start: 2021-06-16 — End: 2021-06-20
  Filled 2021-06-16: qty 5

## 2021-06-16 MED ORDER — FAMOTIDINE IN NACL 20-0.9 MG/50ML-% IV SOLN
20.0000 mg | Freq: Once | INTRAVENOUS | Status: AC
  Administered 2021-06-16: 20 mg via INTRAVENOUS
  Filled 2021-06-16: qty 50

## 2021-06-16 MED ORDER — SODIUM CHLORIDE 0.9 % IV BOLUS
1000.0000 mL | Freq: Once | INTRAVENOUS | Status: AC
  Administered 2021-06-16: 1000 mL via INTRAVENOUS

## 2021-06-16 MED ORDER — PROCHLORPERAZINE MALEATE 10 MG PO TABS
10.0000 mg | ORAL_TABLET | Freq: Four times a day (QID) | ORAL | Status: DC | PRN
  Filled 2021-06-16: qty 1

## 2021-06-16 MED ORDER — DIPHENHYDRAMINE HCL 50 MG/ML IJ SOLN: 12.5000 mg | Freq: Four times a day (QID) | INTRAMUSCULAR | Status: DC | PRN

## 2021-06-16 MED ORDER — HYDROMORPHONE HCL 1 MG/ML IJ SOLN
1.0000 mg | Freq: Once | INTRAMUSCULAR | Status: AC
  Administered 2021-06-16: 1 mg via INTRAVENOUS
  Filled 2021-06-16: qty 1

## 2021-06-16 MED ORDER — LIDOCAINE VISCOUS HCL 2 % MT SOLN
15.0000 mL | Freq: Once | OROMUCOSAL | Status: AC
  Administered 2021-06-16: 15 mL via ORAL
  Filled 2021-06-16: qty 15

## 2021-06-16 MED ORDER — ONDANSETRON HCL 4 MG/2ML IJ SOLN
4.0000 mg | Freq: Once | INTRAMUSCULAR | Status: AC
  Administered 2021-06-16: 4 mg via INTRAVENOUS
  Filled 2021-06-16: qty 2

## 2021-06-16 MED ORDER — PROCHLORPERAZINE EDISYLATE 10 MG/2ML IJ SOLN: 5.0000 mg | Freq: Four times a day (QID) | INTRAMUSCULAR | Status: DC | PRN

## 2021-06-16 NOTE — H&P (Signed)
[name] is an 61 y.o. female.   ?Chief Complaint: ab pain ?HPI: 56 yof with history of abd wall melanoma and hysterectomy who presents with over four weeks of abd pain, not eating as much, less flatus , having bms , some nausea, no emesis.  This has been worsening and not getting better with anything at home.  She presented to outside er and was found on ct scan to have small bowel-small bowel intussusception. She was transferred here. When I see her she has no complaints of pain, somnolent from phenergan. ? ?Past Medical History:  ?Diagnosis Date  ? Contact lens/glasses fitting   ? wears glasses or contacts  ? Melanoma ([place])   ? ? ?Past Surgical History:  ?Procedure Laterality Date  ? ABDOMINAL HYSTERECTOMY  03/2007  ? partial  ? MELANOMA EXCISION  2014  ? abdominal  ? ? ?Family History  ?Problem Relation Age of Onset  ? Hypertension Mother   ? Liver cancer Maternal Grandfather   ? Breast cancer Sister   ? Hypertension Brother   ? Cancer Maternal Grandmother   ?     type unknown  ? Heart disease Maternal Grandmother   ? Heart disease Paternal Grandfather   ? ?Social History:  reports that she has never smoked. She has never used smokeless tobacco. She reports current alcohol use. She reports that she does not use drugs. ? ?Allergies: No Known Allergies ? ? ?Results for orders placed or performed during the hospital encounter of 06/16/21 (from the past 48 hour(s))  ?Lipase, blood     Status: None  ? Collection Time: 06/16/21 11:34 AM  ?Result Value Ref Range  ? Lipase 28 11 - 51 U/L  ?  Comment: Performed at [facility], [address] [place], [place] [address]  ?Comprehensive metabolic panel     Status: Abnormal  ? Collection Time: 06/16/21 11:34 AM  ?Result Value Ref Range  ? Sodium 139 135 - 145 mmol/L  ? Potassium 3.7 3.5 - 5.1 mmol/L  ? Chloride 101 98 - 111 mmol/L  ? CO2 27 22 - 32 mmol/L  ? Glucose, Bld 101 (H) 70 - 99 mg/dL  ?  Comment: Glucose reference range applies only to  samples taken after fasting for at least 8 hours.  ? BUN 13 8 - 23 mg/dL  ? Creatinine, Ser 0.97 0.44 - 1.00 mg/dL  ? Calcium 10.5 (H) 8.9 - 10.3 mg/dL  ? Total Protein 7.6 6.5 - 8.1 g/dL  ? Albumin 4.8 3.5 - 5.0 g/dL  ? AST 28 15 - 41 U/L  ? ALT 22 0 - 44 U/L  ? Alkaline Phosphatase 77 38 - 126 U/L  ? Total Bilirubin 0.7 0.3 - 1.2 mg/dL  ? GFR, Estimated >60 >60 mL/min  ?  Comment: (NOTE) ?Calculated using the CKD-EPI Creatinine Equation (2021) ?  ? Anion gap 11 5 - 15  ?  Comment: Performed at [facility], [address] [place], [place] [address]  ?CBC     Status: None  ? Collection Time: 06/16/21 11:34 AM  ?Result Value Ref Range  ? WBC 7.6 4.0 - 10.5 K/uL  ? RBC 4.73 3.87 - 5.11 MIL/uL  ? Hemoglobin 14.2 12.0 - 15.0 g/dL  ? HCT 42.4 36.0 - 46.0 %  ? MCV 89.6 80.0 - 100.0 fL  ? MCH 30.0 26.0 - 34.0 pg  ? MCHC 33.5 30.0 - 36.0 g/dL  ? RDW 12.7 11.5 - 15.5 %  ?  Platelets 335 150 - 400 K/uL  ? nRBC 0.0 0.0 - 0.2 %  ?  Comment: Performed at [facility], [address] [place], [place] [address]  ?Urinalysis, Routine w reflex microscopic Urine, Clean Catch     Status: Abnormal  ? Collection Time: 06/16/21 11:34 AM  ?Result Value Ref Range  ? Color, Urine YELLOW YELLOW  ? APPearance CLEAR CLEAR  ? Specific Gravity, Urine 1.030 1.005 - 1.030  ? pH 5.5 5.0 - 8.0  ? Glucose, UA NEGATIVE NEGATIVE mg/dL  ? Hgb urine dipstick NEGATIVE NEGATIVE  ? Bilirubin Urine NEGATIVE NEGATIVE  ? Ketones, ur 40 (A) NEGATIVE mg/dL  ? Protein, ur 30 (A) NEGATIVE mg/dL  ? Nitrite NEGATIVE NEGATIVE  ? Leukocytes,Ua TRACE (A) NEGATIVE  ? RBC / HPF 0-5 0 - 5 RBC/hpf  ? WBC, UA 0-5 0 - 5 WBC/hpf  ? Squamous Epithelial / LPF 0-5 0 - 5  ? Mucus PRESENT   ? Hyaline Casts, UA PRESENT   ?  Comment: Performed at [facility], [address], [place], [place] [address]  ? ?CT ABDOMEN PELVIS W CONTRAST ? ?Result Date: 06/16/2021 ?CLINICAL DATA:  Abdominal pain, acute, nonlocalized EXAM: CT  ABDOMEN AND PELVIS WITH CONTRAST TECHNIQUE: Multidetector CT imaging of the abdomen and pelvis was performed using the standard protocol following bolus administration of intravenous contrast. RADIATION DOSE REDUCTION: This exam was performed according to the departmental dose-optimization program which includes automated exposure control, adjustment of the mA and/or kV according to patient size and/or use of iterative reconstruction technique. CONTRAST:  56m OMNIPAQUE IOHEXOL 300 MG/ML  SOLN COMPARISON:  CT 03/16/2007 FINDINGS: Lower chest: Partially imaged dense airspace consolidation in the periphery of the right middle lobe (series 4, image 1). Normal heart size. Hepatobiliary: Small stone within the dependent aspect of the gallbladder. No wall thickening or pericholecystic inflammatory changes evident by CT. No focal liver lesion. Pancreas: Unremarkable. No pancreatic ductal dilatation or surrounding inflammatory changes. Spleen: Normal in size without focal abnormality. Adrenals/Urinary Tract: Adrenal glands are unremarkable. Kidneys are normal, without renal calculi, focal lesion, or hydronephrosis. Urinary bladder is decompressed. Stomach/Bowel: Small bowel-small bowel intussusception within the right lower quadrant (series 2, images 49-59). Small-bowel obstruction upstream from the site of intussusception. Small bowel loops dilated up to 4.3 cm with air-fluid levels. Colon is decompressed. Stomach within normal limits. Vascular/Lymphatic: No significant vascular findings are present. No enlarged abdominal or pelvic lymph nodes. Reproductive: Status post hysterectomy. No adnexal masses. Other: No free fluid. No abdominopelvic fluid collection. No pneumoperitoneum. No abdominal wall hernia. Musculoskeletal: No acute or significant osseous findings. IMPRESSION: 1. Small bowel-small bowel intussusception within the right lower quadrant with associated small bowel obstruction upstream from the site of  intussusception. 2. Partially imaged dense airspace consolidation in the periphery of the right middle lobe, suspicious for pneumonia. Radiographic follow-up to resolution is recommended. 3. Cholelithiasis without evidence of acute cholecystitis. Electronically Signed   By: NDavina PokeD.O.   On: 06/16/2021 12:58  ? ?DG Chest Portable 1 View ? ?Result Date: 06/16/2021 ?CLINICAL DATA:  Abnormal CT EXAM: PORTABLE CHEST 1 VIEW COMPARISON:  None. FINDINGS: Cardiac and mediastinal contours are within normal limits. Right middle lobe nodular opacity. Lungs otherwise clear. No pleural effusion or pneumothorax. IMPRESSION: Right middle nodular opacity which correlates with finding on same day CT of the abdomen and pelvis. Recommend follow-up chest x-ray in 3-4 weeks to ensure resolution, if finding persists, CT of the chest should be performed. Electronically  Signed   By: Yetta Glassman M.D.   On: 06/16/2021 13:45   ? ?Review of Systems  ?Gastrointestinal:  Positive for abdominal pain, constipation and nausea.  ?All other systems reviewed and are negative. ? ?Blood pressure 124/71, pulse (!) 56, temperature 97.6 ?F (36.4 ?C), resp. rate 13, height '5\' 4"'$  (1.626 m), weight 51.7 kg, SpO2 100 %. ?Physical Exam ?Constitutional:   ?   Appearance: She is well-developed.  ?HENT:  ?   Head: Normocephalic and atraumatic.  ?   Mouth/Throat:  ?   Mouth: Mucous membranes are moist.  ?   Pharynx: Oropharynx is clear.  ?Eyes:  ?   General: No scleral icterus. ?   Extraocular Movements: Extraocular movements intact.  ?   Pupils: Pupils are equal, round, and reactive to light.  ?Cardiovascular:  ?   Rate and Rhythm: Regular rhythm. Bradycardia present.  ?Pulmonary:  ?   Effort: Pulmonary effort is normal.  ?   Breath sounds: Normal breath sounds. No wheezing.  ?Abdominal:  ?   General: Abdomen is flat. A surgical scar is present. Bowel sounds are decreased.  ?   Palpations: Abdomen is soft.  ?   Tenderness: There is no abdominal  tenderness.  ?   Hernia: No hernia is present.  ?Skin: ?   General: Skin is warm and dry.  ?   Capillary Refill: Capillary refill takes less than 2 seconds.  ?Neurological:  ?   General: No focal deficit present.

## 2021-06-16 NOTE — ED Notes (Signed)
Pt given ice chips and updated on POC  ?

## 2021-06-16 NOTE — ED Triage Notes (Signed)
Patient here POV from Home. ? ?Patient endorses Intermittent ABD Pain since Late March but more recently (Past Week) it has increased, especially Thursday. Pain is entire Mid ABD. ? ?Patient visited UC on Thursday and Lab Studies were WNL. ? ?Appointment for GI in Late April. History of Diverticulosis.  ? ?NAD Noted during Triage. A%Ox4. GCS 15. Ambulatory. ?

## 2021-06-16 NOTE — ED Notes (Signed)
Surgery MD at bedside.

## 2021-06-16 NOTE — ED Triage Notes (Signed)
Via [facility] from [place]. Pt reports NVD and abd pain x 1 day. CT showed intussescepton. Pt transferred here for surgery. 12.5 phenergan given en route  ?

## 2021-06-16 NOTE — ED Notes (Signed)
ED Provider at bedside. 

## 2021-06-16 NOTE — ED Notes (Signed)
Report given to [facility]. 

## 2021-06-16 NOTE — ED Notes (Signed)
Ranette - RN aware of pt's pulse ?

## 2021-06-16 NOTE — ED Notes (Addendum)
Patient ambulated to the bathroom; Daughter and husband in the room visiting with patient at this time; pt denies pain; pt provided ice chips per request-Monique,RN  ?

## 2021-06-16 NOTE — ED Provider Notes (Signed)
?[facility] ?Provider Note ? ? ?CSN: [id number] ?Arrival date & time: 06/16/21  1100 ? ?  ? ?History ? ?Chief Complaint  ?Patient presents with  ? Abdominal Pain  ? ? ?[name] is a 61 y.o. female. ? ? Patient as above with significant medical history as below, including abd hysterectomy who presents to the ED with complaint of abd pain. ? ?Location:  epigastrium, RUQ, periumbilical ?Duration:  3-4 wks ?Onset:  gradual ?Timing:  intermittent now constant  ?Description:  sharp, stabbing, aching ?Severity:  moderate ?Exacerbating/Alleviating Factors:  worse after PO ?Associated Symptoms:  nausea ?Pertinent Negatives:  no fevers or chills, no change to bladder fxn.  ?Context: progressively worsening pain over last month, worse in last few days.  ? ? ? ?Past Medical History: ?No date: Contact lens/glasses fitting ?    Comment:  wears glasses or contacts ?No date: Melanoma ([place]) ? ?Past Surgical History: ?03/2007: ABDOMINAL HYSTERECTOMY ?    Comment:  partial ?2014: MELANOMA EXCISION ?    Comment:  abdominal  ? ? ?The history is provided by the patient. No language interpreter was used.  ?Abdominal Pain ?Associated symptoms: fatigue and nausea   ?Associated symptoms: no chest pain, no chills, no cough, no fever, no hematuria, no shortness of breath and no vomiting   ? ?  ? ?Home Medications ?Prior to Admission medications   ?Not on File  ?   ? ?Allergies    ?Patient has no known allergies.   ? ?Review of Systems   ?Review of Systems  ?Constitutional:  Positive for appetite change and fatigue. Negative for chills and fever.  ?HENT:  Negative for facial swelling and trouble swallowing.   ?Eyes:  Negative for photophobia and visual disturbance.  ?Respiratory:  Negative for cough and shortness of breath.   ?Cardiovascular:  Negative for chest pain and palpitations.  ?Gastrointestinal:  Positive for abdominal pain and nausea. Negative for vomiting.  ?Endocrine: Negative for polydipsia and  polyuria.  ?Genitourinary:  Negative for difficulty urinating and hematuria.  ?Musculoskeletal:  Negative for gait problem and joint swelling.  ?Skin:  Negative for pallor and rash.  ?Neurological:  Negative for syncope and headaches.  ?Psychiatric/Behavioral:  Negative for agitation and confusion.   ? ?Physical Exam ?Updated Vital Signs ?BP 109/70   Pulse (!) 57   Temp 97.6 ?F (36.4 ?C)   Resp 16   Ht '5\' 4"'$  (1.626 m)   Wt 51.7 kg   SpO2 96%   BMI 19.56 kg/m?  ?Physical Exam ?Vitals and nursing note reviewed.  ?Constitutional:   ?   General: She is not in acute distress. ?   Appearance: Normal appearance.  ?HENT:  ?   Head: Normocephalic and atraumatic.  ?   Right Ear: External ear normal.  ?   Left Ear: External ear normal.  ?   Nose: Nose normal.  ?   Mouth/Throat:  ?   Mouth: Mucous membranes are moist.  ?Eyes:  ?   General: No scleral icterus.    ?   Right eye: No discharge.     ?   Left eye: No discharge.  ?Cardiovascular:  ?   Rate and Rhythm: Normal rate and regular rhythm.  ?   Pulses: Normal pulses.  ?   Heart sounds: Normal heart sounds.  ?Pulmonary:  ?   Effort: Pulmonary effort is normal. No respiratory distress.  ?   Breath sounds: Normal breath sounds.  ?Abdominal:  ?   General: Abdomen  is flat. There is no distension.  ?   Palpations: Abdomen is soft.  ?   Tenderness: There is abdominal tenderness in the right upper quadrant, epigastric area and periumbilical area. There is no guarding or rebound. Negative signs include Murphy's sign.  ?   Comments: Not peritoneal  ?Musculoskeletal:     ?   General: Normal range of motion.  ?   Cervical back: Normal range of motion.  ?   Right lower leg: No edema.  ?   Left lower leg: No edema.  ?Skin: ?   General: Skin is warm and dry.  ?   Capillary Refill: Capillary refill takes less than 2 seconds.  ?Neurological:  ?   Mental Status: She is alert.  ?Psychiatric:     ?   Mood and Affect: Mood normal.     ?   Behavior: Behavior normal.  ? ? ?ED Results /  Procedures / Treatments   ?Labs ?(all labs ordered are listed, but only abnormal results are displayed) ?Labs Reviewed  ?COMPREHENSIVE METABOLIC PANEL - Abnormal; Notable for the following components:  ?    Result Value  ? Glucose, Bld 101 (*)   ? Calcium 10.5 (*)   ? All other components within normal limits  ?URINALYSIS, ROUTINE W REFLEX MICROSCOPIC - Abnormal; Notable for the following components:  ? Ketones, ur 40 (*)   ? Protein, ur 30 (*)   ? Leukocytes,Ua TRACE (*)   ? All other components within normal limits  ?LIPASE, BLOOD  ?CBC  ? ? ?EKG ?None ? ?Radiology ?CT ABDOMEN PELVIS W CONTRAST ? ?Result Date: 06/16/2021 ?CLINICAL DATA:  Abdominal pain, acute, nonlocalized EXAM: CT ABDOMEN AND PELVIS WITH CONTRAST TECHNIQUE: Multidetector CT imaging of the abdomen and pelvis was performed using the standard protocol following bolus administration of intravenous contrast. RADIATION DOSE REDUCTION: This exam was performed according to the departmental dose-optimization program which includes automated exposure control, adjustment of the mA and/or kV according to patient size and/or use of iterative reconstruction technique. CONTRAST:  33m OMNIPAQUE IOHEXOL 300 MG/ML  SOLN COMPARISON:  CT 03/16/2007 FINDINGS: Lower chest: Partially imaged dense airspace consolidation in the periphery of the right middle lobe (series 4, image 1). Normal heart size. Hepatobiliary: Small stone within the dependent aspect of the gallbladder. No wall thickening or pericholecystic inflammatory changes evident by CT. No focal liver lesion. Pancreas: Unremarkable. No pancreatic ductal dilatation or surrounding inflammatory changes. Spleen: Normal in size without focal abnormality. Adrenals/Urinary Tract: Adrenal glands are unremarkable. Kidneys are normal, without renal calculi, focal lesion, or hydronephrosis. Urinary bladder is decompressed. Stomach/Bowel: Small bowel-small bowel intussusception within the right lower quadrant (series 2,  images 49-59). Small-bowel obstruction upstream from the site of intussusception. Small bowel loops dilated up to 4.3 cm with air-fluid levels. Colon is decompressed. Stomach within normal limits. Vascular/Lymphatic: No significant vascular findings are present. No enlarged abdominal or pelvic lymph nodes. Reproductive: Status post hysterectomy. No adnexal masses. Other: No free fluid. No abdominopelvic fluid collection. No pneumoperitoneum. No abdominal wall hernia. Musculoskeletal: No acute or significant osseous findings. IMPRESSION: 1. Small bowel-small bowel intussusception within the right lower quadrant with associated small bowel obstruction upstream from the site of intussusception. 2. Partially imaged dense airspace consolidation in the periphery of the right middle lobe, suspicious for pneumonia. Radiographic follow-up to resolution is recommended. 3. Cholelithiasis without evidence of acute cholecystitis. Electronically Signed   By: NDavina PokeD.O.   On: 06/16/2021 12:58  ? ?DG Chest Portable 1  View ? ?Result Date: 06/16/2021 ?CLINICAL DATA:  Abnormal CT EXAM: PORTABLE CHEST 1 VIEW COMPARISON:  None. FINDINGS: Cardiac and mediastinal contours are within normal limits. Right middle lobe nodular opacity. Lungs otherwise clear. No pleural effusion or pneumothorax. IMPRESSION: Right middle nodular opacity which correlates with finding on same day CT of the abdomen and pelvis. Recommend follow-up chest x-ray in 3-4 weeks to ensure resolution, if finding persists, CT of the chest should be performed. Electronically Signed   By: Yetta Glassman M.D.   On: 06/16/2021 13:45   ? ?Procedures ?Procedures  ? ? ?Medications Ordered in ED ?Medications  ?lactated ringers infusion ( Intravenous New Bag/Given 06/16/21 1359)  ?famotidine (PEPCID) IVPB 20 mg premix (0 mg Intravenous Stopped 06/16/21 1327)  ?alum & mag hydroxide-simeth (MAALOX/MYLANTA) 200-200-20 MG/5ML suspension 30 mL (30 mLs Oral Given 06/16/21 1222)   ?  And  ?lidocaine (XYLOCAINE) 2 % viscous mouth solution 15 mL (15 mLs Oral Given 06/16/21 1222)  ?dicyclomine (BENTYL) capsule 10 mg (10 mg Oral Given 06/16/21 1221)  ?sodium chloride 0.9 % bolus 1,000 mL (0 mLs

## 2021-06-16 NOTE — ED Provider Notes (Signed)
?[facility] ?Provider Note ? ? ?CSN: [id number] ?Arrival date & time: 06/16/21  1100 ? ?  ? ?History ? ?Chief Complaint  ?Patient presents with  ? Abdominal Pain  ? ? ?[name] is a 61 y.o. female. ? ? ?Abdominal Pain ? ?61 year old female presenting to the emergency department on transfer from the med center [facility] emergency department due to concern for intussusception of the small bowel and subsequent small bowel obstruction.  The patient has had intermittent abdominal pain for the last few weeks.  CT imaging was performed at the outside emergency department which revealed intussusception of the small bowel in the right lower quadrant with associated small bowel obstruction upstream of the site of intussusception.  The patient was transferred to the emergency department for surgical consultation.  On arrival, the patient was hemodynamically stable.  Her pain was well controlled.  Surgery was consulted on arrival. ? ?Home Medications ?Prior to Admission medications   ?Medication Sig Start Date End Date Taking? Authorizing Provider  ?MAGNESIUM CITRATE PO Take 1 tablet by mouth every evening.   Yes [provider]  ?   ? ?Allergies    ?Patient has no known allergies.   ? ?Review of Systems   ?Review of Systems  ?Gastrointestinal:  Positive for abdominal pain.  ?All other systems reviewed and are negative. ? ?Physical Exam ?Updated Vital Signs ?BP 105/73 (BP Location: Left Arm)   Pulse (!) 43   Temp 98.5 ?F (36.9 ?C) (Oral)   Resp 20   Ht '5\' 4"'$  (1.626 m)   Wt 51.7 kg   SpO2 99%   BMI 19.56 kg/m?  ?Physical Exam ?Vitals and nursing note reviewed.  ?Constitutional:   ?   General: She is not in acute distress. ?HENT:  ?   Head: Normocephalic and atraumatic.  ?Eyes:  ?   Conjunctiva/sclera: Conjunctivae normal.  ?   Pupils: Pupils are equal, round, and reactive to light.  ?Cardiovascular:  ?   Rate and Rhythm: Normal rate and regular rhythm.  ?Pulmonary:   ?   Effort: Pulmonary effort is normal. No respiratory distress.  ?Abdominal:  ?   General: There is no distension.  ?   Tenderness: There is generalized abdominal tenderness. There is no guarding.  ?Musculoskeletal:     ?   General: No deformity or signs of injury.  ?   Cervical back: Neck supple.  ?Skin: ?   Findings: No lesion or rash.  ?Neurological:  ?   General: No focal deficit present.  ?   Mental Status: She is alert. Mental status is at baseline.  ? ? ?ED Results / Procedures / Treatments   ?Labs ?(all labs ordered are listed, but only abnormal results are displayed) ?Labs Reviewed  ?COMPREHENSIVE METABOLIC PANEL - Abnormal; Notable for the following components:  ?    Result Value  ? Glucose, Bld 101 (*)   ? Calcium 10.5 (*)   ? All other components within normal limits  ?URINALYSIS, ROUTINE W REFLEX MICROSCOPIC - Abnormal; Notable for the following components:  ? Ketones, ur 40 (*)   ? Protein, ur 30 (*)   ? Leukocytes,Ua TRACE (*)   ? All other components within normal limits  ?LIPASE, BLOOD  ?CBC  ?BASIC METABOLIC PANEL  ?CBC  ? ? ?EKG ?None ? ?Radiology ?CT ABDOMEN PELVIS W CONTRAST ? ?Result Date: 06/16/2021 ?CLINICAL DATA:  Abdominal pain, acute, nonlocalized EXAM: CT ABDOMEN AND PELVIS WITH CONTRAST TECHNIQUE: Multidetector CT imaging  of the abdomen and pelvis was performed using the standard protocol following bolus administration of intravenous contrast. RADIATION DOSE REDUCTION: This exam was performed according to the departmental dose-optimization program which includes automated exposure control, adjustment of the mA and/or kV according to patient size and/or use of iterative reconstruction technique. CONTRAST:  67m OMNIPAQUE IOHEXOL 300 MG/ML  SOLN COMPARISON:  CT 03/16/2007 FINDINGS: Lower chest: Partially imaged dense airspace consolidation in the periphery of the right middle lobe (series 4, image 1). Normal heart size. Hepatobiliary: Small stone within the dependent aspect of the  gallbladder. No wall thickening or pericholecystic inflammatory changes evident by CT. No focal liver lesion. Pancreas: Unremarkable. No pancreatic ductal dilatation or surrounding inflammatory changes. Spleen: Normal in size without focal abnormality. Adrenals/Urinary Tract: Adrenal glands are unremarkable. Kidneys are normal, without renal calculi, focal lesion, or hydronephrosis. Urinary bladder is decompressed. Stomach/Bowel: Small bowel-small bowel intussusception within the right lower quadrant (series 2, images 49-59). Small-bowel obstruction upstream from the site of intussusception. Small bowel loops dilated up to 4.3 cm with air-fluid levels. Colon is decompressed. Stomach within normal limits. Vascular/Lymphatic: No significant vascular findings are present. No enlarged abdominal or pelvic lymph nodes. Reproductive: Status post hysterectomy. No adnexal masses. Other: No free fluid. No abdominopelvic fluid collection. No pneumoperitoneum. No abdominal wall hernia. Musculoskeletal: No acute or significant osseous findings. IMPRESSION: 1. Small bowel-small bowel intussusception within the right lower quadrant with associated small bowel obstruction upstream from the site of intussusception. 2. Partially imaged dense airspace consolidation in the periphery of the right middle lobe, suspicious for pneumonia. Radiographic follow-up to resolution is recommended. 3. Cholelithiasis without evidence of acute cholecystitis. Electronically Signed   By: NDavina PokeD.O.   On: 06/16/2021 12:58  ? ?DG Chest Portable 1 View ? ?Result Date: 06/16/2021 ?CLINICAL DATA:  Abnormal CT EXAM: PORTABLE CHEST 1 VIEW COMPARISON:  None. FINDINGS: Cardiac and mediastinal contours are within normal limits. Right middle lobe nodular opacity. Lungs otherwise clear. No pleural effusion or pneumothorax. IMPRESSION: Right middle nodular opacity which correlates with finding on same day CT of the abdomen and pelvis. Recommend follow-up  chest x-ray in 3-4 weeks to ensure resolution, if finding persists, CT of the chest should be performed. Electronically Signed   By: LYetta GlassmanM.D.   On: 06/16/2021 13:45   ? ?Procedures ?Procedures  ? ? ?Medications Ordered in ED ?Medications  ?lactated ringers infusion (1,000 mLs Intravenous New Bag/Given 06/16/21 2247)  ?enoxaparin (LOVENOX) injection 40 mg (40 mg Subcutaneous Given 06/16/21 1644)  ?morphine (PF) 2 MG/ML injection 2 mg (2 mg Intravenous Given 06/16/21 2241)  ?ondansetron (ZOFRAN-ODT) disintegrating tablet 4 mg ( Oral See Alternative 06/16/21 2241)  ?  Or  ?ondansetron ([facility] injection 4 mg (4 mg Intravenous Given 06/16/21 2241)  ?prochlorperazine (COMPAZINE) tablet 10 mg (has no administration in time range)  ?  Or  ?prochlorperazine (COMPAZINE) injection 5-10 mg (has no administration in time range)  ?diphenhydrAMINE (BENADRYL) 12.5 MG/5ML elixir 12.5 mg (has no administration in time range)  ?  Or  ?diphenhydrAMINE (BENADRYL) injection 12.5 mg (has no administration in time range)  ?famotidine (PEPCID) IVPB 20 mg premix (0 mg Intravenous Stopped 06/16/21 1327)  ?alum & mag hydroxide-simeth (MAALOX/MYLANTA) 200-200-20 MG/5ML suspension 30 mL (30 mLs Oral Given 06/16/21 1222)  ?  And  ?lidocaine (XYLOCAINE) 2 % viscous mouth solution 15 mL (15 mLs Oral Given 06/16/21 1222)  ?dicyclomine (BENTYL) capsule 10 mg (10 mg Oral Given 06/16/21 1221)  ?sodium chloride 0.9 %  bolus 1,000 mL (0 mLs Intravenous Stopped 06/16/21 1340)  ?iohexol (OMNIPAQUE) 300 MG/ML solution 100 mL (75 mLs Intravenous Contrast Given 06/16/21 1240)  ?HYDROmorphone (DILAUDID) injection 1 mg (1 mg Intravenous Given 06/16/21 1402)  ?ondansetron [facility]) injection 4 mg (4 mg Intravenous Given 06/16/21 1359)  ?sodium chloride 0.9 % bolus 500 mL (0 mLs Intravenous Stopped 06/16/21 1805)  ? ? ?ED Course/ Medical Decision Making/ A&P ?Clinical Course as of 06/16/21 2256  ?Mon Jun 16, 2021  ?1303 IMPRESSION: ?1. Small bowel-small bowel  intussusception within the right lower ?quadrant with associated small bowel obstruction upstream from the ?site of intussusception. ?2. Partially imaged dense airspace consolidation in the periphery of ?the right midd

## 2021-06-16 NOTE — Discharge Instructions (Addendum)
[facility], [place] ?[phone] ? ?ABDOMINAL SURGERY: POST OP INSTRUCTIONS ? ?Always review your discharge instruction sheet given to you by the facility where your surgery was performed. ? ?IF YOU HAVE DISABILITY OR FAMILY LEAVE FORMS, YOU MUST BRING THEM TO THE OFFICE FOR PROCESSING.  PLEASE DO NOT GIVE THEM TO YOUR DOCTOR. ? ?A prescription for pain medication may be given to you upon discharge.  Take your pain medication as prescribed, if needed.  If narcotic pain medicine is not needed, then you may take acetaminophen (Tylenol) or ibuprofen (Advil) as needed. ?Take your usually prescribed medications unless otherwise directed. ?If you need a refill on your pain medication, please contact your pharmacy. They will contact our office to request authorization.  Prescriptions will not be filled after 5pm or on week-ends. ?You should follow a light diet the first few days after arrival home, such as soup and crackers, pudding, etc.unless your doctor has advised otherwise. A high-fiber, low fat diet can be resumed as tolerated.   Be sure to include lots of fluids daily. Most patients will experience some swelling and bruising on the chest and neck area.  Ice packs will help.  Swelling and bruising can take several days to resolve ?Most patients will experience some swelling and bruising in the area of the incision. Ice pack will help. Swelling and bruising can take several days to resolve.Marland Kitchen  ?It is common to experience some constipation if taking pain medication after surgery.  Increasing fluid intake and taking a stool softener will usually help or prevent this problem from occurring.  A mild laxative (Milk of Magnesia or Miralax) should be taken according to package directions if there are no bowel movements after 48 hours. ? You may have steri-strips (small skin tapes) in place directly over the incision.  These strips should be left on the skin for 7-10 days.  If your surgeon used skin glue on  the incision, you may shower in 24 hours.  The glue will flake off over the next 2-3 weeks.  Any sutures or staples will be removed at the office during your follow-up visit. You may find that a light gauze bandage over your incision may keep your staples from being rubbed or pulled. You may shower and replace the bandage daily. ?ACTIVITIES:  You may resume regular (light) daily activities beginning the next day--such as daily self-care, walking, climbing stairs--gradually increasing activities as tolerated.  You may have sexual intercourse when it is comfortable.  Refrain from any heavy lifting or straining until approved by your doctor. ?You may drive when you no longer are taking prescription pain medication, you can comfortably wear a seatbelt, and you can safely maneuver your car and apply brakes ?You should see your doctor in the office for a follow-up appointment approximately two weeks after your surgery.  Make sure that you call for this appointment within a day or two after you arrive home to insure a convenient appointment time. ? ? ?WHEN TO CALL YOUR DOCTOR: ?Fever over 101.0 ?Inability to urinate ?Nausea and/or vomiting ?Extreme swelling or bruising ?Continued bleeding from incision. ?Increased pain, redness, or drainage from the incision. ?Difficulty swallowing or breathing ?Muscle cramping or spasms. ?Numbness or tingling in hands or feet or around lips. ? ?The clinic staff is available to answer your questions during regular business hours.  Please don?t hesitate to call and ask to speak to one of the nurses if you have concerns. ? ?For further  questions, please visit [email or web address] ? ? ?

## 2021-06-16 NOTE — ED Notes (Signed)
Denies nausea or pain at this time. Resting comfortably. Will continue to monitor.  ?

## 2021-06-17 ENCOUNTER — Inpatient Hospital Stay (HOSPITAL_COMMUNITY): Payer: BC Managed Care – PPO | Admitting: Anesthesiology

## 2021-06-17 ENCOUNTER — Encounter (HOSPITAL_COMMUNITY): Admission: EM | Disposition: A | Discharge status: Home / Self Care | Payer: Self-pay | Source: Home / Self Care

## 2021-06-17 ENCOUNTER — Encounter (HOSPITAL_COMMUNITY): Payer: Self-pay

## 2021-06-17 ENCOUNTER — Other Ambulatory Visit: Payer: Self-pay

## 2021-06-17 HISTORY — PX: LAPAROSCOPY: SHX197

## 2021-06-17 HISTORY — PX: BOWEL RESECTION: SHX1257

## 2021-06-17 LAB — TYPE AND SCREEN
ABO/RH(D): O NEG
Antibody Screen: NEGATIVE

## 2021-06-17 LAB — GLUCOSE, CAPILLARY
Glucose-Capillary: 51 mg/dL — ABNORMAL LOW (ref 70–99)
Glucose-Capillary: 218 mg/dL — ABNORMAL HIGH (ref 70–99)
Glucose-Capillary: 99 mg/dL (ref 70–99)
Glucose-Capillary: 103 mg/dL — ABNORMAL HIGH (ref 70–99)
Glucose-Capillary: 93 mg/dL (ref 70–99)

## 2021-06-17 LAB — CBC
HCT: 35 % — ABNORMAL LOW (ref 36.0–46.0)
Hemoglobin: 11.6 g/dL — ABNORMAL LOW (ref 12.0–15.0)
MCH: 30.8 pg (ref 26.0–34.0)
MCHC: 33.1 g/dL (ref 30.0–36.0)
MCV: 92.8 fL (ref 80.0–100.0)
Platelets: 245 10*3/uL (ref 150–400)
RBC: 3.77 MIL/uL — ABNORMAL LOW (ref 3.87–5.11)
RDW: 13 % (ref 11.5–15.5)
WBC: 7.5 10*3/uL (ref 4.0–10.5)
nRBC: 0 % (ref 0.0–0.2)

## 2021-06-17 LAB — BASIC METABOLIC PANEL
Anion gap: 9 (ref 5–15)
BUN: 10 mg/dL (ref 8–23)
CO2: 22 mmol/L (ref 22–32)
Calcium: 8.7 mg/dL — ABNORMAL LOW (ref 8.9–10.3)
Chloride: 108 mmol/L (ref 98–111)
Creatinine, Ser: 0.91 mg/dL (ref 0.44–1.00)
GFR, Estimated: >60 mL/min (ref >60)
Glucose, Bld: 61 mg/dL — ABNORMAL LOW (ref 70–99)
Potassium: 4.1 mmol/L (ref 3.5–5.1)
Sodium: 139 mmol/L (ref 135–145)

## 2021-06-17 LAB — ABO/RH: ABO/RH(D): O NEG

## 2021-06-17 SURGERY — LAPAROSCOPY, DIAGNOSTIC
Anesthesia: General

## 2021-06-17 MED ORDER — MIDAZOLAM HCL 2 MG/2ML IJ SOLN
INTRAMUSCULAR | Status: AC
  Filled 2021-06-17: qty 2

## 2021-06-17 MED ORDER — DEXAMETHASONE SODIUM PHOSPHATE 10 MG/ML IJ SOLN
INTRAMUSCULAR | Status: AC
  Filled 2021-06-17: qty 1

## 2021-06-17 MED ORDER — LIDOCAINE 2% (20 MG/ML) 5 ML SYRINGE
INTRAMUSCULAR | Status: AC
  Filled 2021-06-17: qty 5

## 2021-06-17 MED ORDER — SODIUM CHLORIDE 0.9 % IV SOLN: INTRAVENOUS | Status: DC

## 2021-06-17 MED ORDER — LIDOCAINE 2% (20 MG/ML) 5 ML SYRINGE
INTRAMUSCULAR | Status: DC | PRN
  Administered 2021-06-17: 60 mg via INTRAVENOUS

## 2021-06-17 MED ORDER — ONDANSETRON HCL 4 MG/2ML IJ SOLN
INTRAMUSCULAR | Status: DC | PRN
  Administered 2021-06-17: 4 mg via INTRAVENOUS

## 2021-06-17 MED ORDER — DROPERIDOL 2.5 MG/ML IJ SOLN
0.6250 mg | Freq: Once | INTRAMUSCULAR | Status: AC | PRN
  Administered 2021-06-17: 0.625 mg via INTRAVENOUS

## 2021-06-17 MED ORDER — DROPERIDOL 2.5 MG/ML IJ SOLN
INTRAMUSCULAR | Status: AC
  Filled 2021-06-17: qty 2

## 2021-06-17 MED ORDER — BUPIVACAINE-EPINEPHRINE 0.25% -1:200000 IJ SOLN
INTRAMUSCULAR | Status: DC | PRN
Start: 2021-06-17 — End: 2021-06-17
  Administered 2021-06-17: 3 mL

## 2021-06-17 MED ORDER — GLYCOPYRROLATE PF 0.2 MG/ML IJ SOSY
PREFILLED_SYRINGE | INTRAMUSCULAR | Status: AC
  Filled 2021-06-17: qty 2

## 2021-06-17 MED ORDER — 0.9 % SODIUM CHLORIDE (POUR BTL) OPTIME
TOPICAL | Status: DC | PRN
Start: 2021-06-17 — End: 2021-06-17
  Administered 2021-06-17: 2000 mL

## 2021-06-17 MED ORDER — PROPOFOL 10 MG/ML IV BOLUS
INTRAVENOUS | Status: AC
  Filled 2021-06-17: qty 20

## 2021-06-17 MED ORDER — ROCURONIUM BROMIDE 100 MG/10ML IV SOLN
INTRAVENOUS | Status: DC | PRN
  Administered 2021-06-17: 40 mg via INTRAVENOUS

## 2021-06-17 MED ORDER — DEXAMETHASONE SODIUM PHOSPHATE 10 MG/ML IJ SOLN
INTRAMUSCULAR | Status: DC | PRN
  Administered 2021-06-17: 8 mg via INTRAVENOUS

## 2021-06-17 MED ORDER — MIDAZOLAM HCL 5 MG/5ML IJ SOLN
INTRAMUSCULAR | Status: DC | PRN
  Administered 2021-06-17: 1 mg via INTRAVENOUS

## 2021-06-17 MED ORDER — HYDROMORPHONE HCL 1 MG/ML IJ SOLN
INTRAMUSCULAR | Status: AC
  Filled 2021-06-17: qty 1

## 2021-06-17 MED ORDER — CHLORHEXIDINE GLUCONATE 0.12% ORAL RINSE (MEDLINE KIT)
15.0000 mL | OROMUCOSAL | Status: AC
  Administered 2021-06-17: 15 mL via OROMUCOSAL

## 2021-06-17 MED ORDER — CEFAZOLIN SODIUM-DEXTROSE 2-4 GM/100ML-% IV SOLN
INTRAVENOUS | Status: AC
  Filled 2021-06-17: qty 100

## 2021-06-17 MED ORDER — BUPIVACAINE-EPINEPHRINE (PF) 0.25% -1:200000 IJ SOLN
INTRAMUSCULAR | Status: AC
  Filled 2021-06-17: qty 30

## 2021-06-17 MED ORDER — LACTATED RINGERS IV SOLN: INTRAVENOUS | Status: DC

## 2021-06-17 MED ORDER — KETOROLAC TROMETHAMINE 30 MG/ML IJ SOLN
INTRAMUSCULAR | Status: AC
  Filled 2021-06-17: qty 1

## 2021-06-17 MED ORDER — CHLORHEXIDINE GLUCONATE 0.12 % MT SOLN
OROMUCOSAL | Status: AC
  Administered 2021-06-17: 15 mL via OROMUCOSAL
  Filled 2021-06-17: qty 15

## 2021-06-17 MED ORDER — KETOROLAC TROMETHAMINE 30 MG/ML IJ SOLN
INTRAMUSCULAR | Status: DC | PRN
  Administered 2021-06-17: 30 mg via INTRAVENOUS

## 2021-06-17 MED ORDER — DEXTROSE 50 % IV SOLN
INTRAVENOUS | Status: AC
  Filled 2021-06-17: qty 50

## 2021-06-17 MED ORDER — ACETAMINOPHEN 10 MG/ML IV SOLN
INTRAVENOUS | Status: AC
  Filled 2021-06-17: qty 100

## 2021-06-17 MED ORDER — HYDROMORPHONE HCL 1 MG/ML IJ SOLN
0.2500 mg | INTRAMUSCULAR | Status: DC | PRN
  Administered 2021-06-17 (×2): 0.25 mg via INTRAVENOUS

## 2021-06-17 MED ORDER — ACETAMINOPHEN 10 MG/ML IV SOLN
INTRAVENOUS | Status: DC | PRN
  Administered 2021-06-17: 1000 mg via INTRAVENOUS

## 2021-06-17 MED ORDER — CEFAZOLIN SODIUM-DEXTROSE 2-3 GM-%(50ML) IV SOLR
INTRAVENOUS | Status: DC | PRN
  Administered 2021-06-17: 2 g via INTRAVENOUS

## 2021-06-17 MED ORDER — OXYCODONE HCL 5 MG PO TABS
5.0000 mg | ORAL_TABLET | Freq: Four times a day (QID) | ORAL | Status: DC | PRN
  Administered 2021-06-17 – 2021-06-18 (×3): 5 mg via ORAL
  Filled 2021-06-17 (×4): qty 1

## 2021-06-17 MED ORDER — SUGAMMADEX SODIUM 200 MG/2ML IV SOLN
INTRAVENOUS | Status: DC | PRN
  Administered 2021-06-17: 100 mg via INTRAVENOUS

## 2021-06-17 MED ORDER — ONDANSETRON HCL 4 MG/2ML IJ SOLN
INTRAMUSCULAR | Status: AC
  Filled 2021-06-17: qty 2

## 2021-06-17 MED ORDER — FENTANYL CITRATE (PF) 250 MCG/5ML IJ SOLN
INTRAMUSCULAR | Status: AC
  Filled 2021-06-17: qty 5

## 2021-06-17 MED ORDER — SUCCINYLCHOLINE CHLORIDE 200 MG/10ML IV SOSY
PREFILLED_SYRINGE | INTRAVENOUS | Status: DC | PRN
  Administered 2021-06-17: 80 mg via INTRAVENOUS

## 2021-06-17 MED ORDER — EPHEDRINE SULFATE-NACL 50-0.9 MG/10ML-% IV SOSY
PREFILLED_SYRINGE | INTRAVENOUS | Status: DC | PRN
  Administered 2021-06-17: 10 mg via INTRAVENOUS

## 2021-06-17 MED ORDER — PROPOFOL 10 MG/ML IV BOLUS
INTRAVENOUS | Status: DC | PRN
  Administered 2021-06-17: 100 mg via INTRAVENOUS

## 2021-06-17 MED ORDER — SCOPOLAMINE 1 MG/3DAYS TD PT72
1.0000 | MEDICATED_PATCH | Freq: Once | TRANSDERMAL | Status: AC
  Administered 2021-06-17: 1.5 mg via TRANSDERMAL

## 2021-06-17 MED ORDER — LACTATED RINGERS IV SOLN
INTRAVENOUS | Status: DC | PRN
Start: 2021-06-17 — End: 2021-06-17

## 2021-06-17 MED ORDER — GLYCOPYRROLATE 0.2 MG/ML IJ SOLN
INTRAMUSCULAR | Status: DC | PRN
  Administered 2021-06-17: .1 mg via INTRAVENOUS

## 2021-06-17 MED ORDER — SCOPOLAMINE 1 MG/3DAYS TD PT72
MEDICATED_PATCH | TRANSDERMAL | Status: AC
  Filled 2021-06-17: qty 1

## 2021-06-17 MED ORDER — FENTANYL CITRATE (PF) 100 MCG/2ML IJ SOLN
INTRAMUSCULAR | Status: DC | PRN
  Administered 2021-06-17: 25 ug via INTRAVENOUS
  Administered 2021-06-17: 50 ug via INTRAVENOUS

## 2021-06-17 SURGICAL SUPPLY — 65 items
ADH SKN CLS APL DERMABOND .7 (GAUZE/BANDAGES/DRESSINGS) ×1
APL PRP STRL LF DISP 70% ISPRP (MISCELLANEOUS) ×1
APPLIER CLIP 5 13 M/L LIGAMAX5 (MISCELLANEOUS)
APR CLP MED LRG 5 ANG JAW (MISCELLANEOUS)
BAG COUNTER SPONGE SURGICOUNT (BAG) ×3 IMPLANT
BAG SPNG CNTER NS LX DISP (BAG) ×1
BLADE CLIPPER SURG (BLADE) IMPLANT
CANISTER SUCT 3000ML PPV (MISCELLANEOUS) ×3 IMPLANT
CHLORAPREP W/TINT 26 (MISCELLANEOUS) ×3 IMPLANT
CLIP APPLIE 5 13 M/L LIGAMAX5 (MISCELLANEOUS) IMPLANT
COVER SURGICAL LIGHT HANDLE (MISCELLANEOUS) ×3 IMPLANT
DERMABOND ADVANCED (GAUZE/BANDAGES/DRESSINGS) ×1
DERMABOND ADVANCED .7 DNX12 (GAUZE/BANDAGES/DRESSINGS) ×2 IMPLANT
DRAPE LAPAROSCOPIC ABDOMINAL (DRAPES) ×3 IMPLANT
DRAPE WARM FLUID 44X44 (DRAPES) ×3 IMPLANT
DRSG OPSITE POSTOP 3X4 (GAUZE/BANDAGES/DRESSINGS) ×1 IMPLANT
DRSG OPSITE POSTOP 4X10 (GAUZE/BANDAGES/DRESSINGS) IMPLANT
DRSG OPSITE POSTOP 4X8 (GAUZE/BANDAGES/DRESSINGS) IMPLANT
ELECT BLADE 6.5 EXT (BLADE) IMPLANT
ELECT CAUTERY BLADE 6.4 (BLADE) IMPLANT
ELECT REM PT RETURN 9FT ADLT (ELECTROSURGICAL) ×2
ELECTRODE REM PT RTRN 9FT ADLT (ELECTROSURGICAL) ×2 IMPLANT
GLOVE BIO SURGEON STRL SZ7 (GLOVE) ×3 IMPLANT
GLOVE BIOGEL PI IND STRL 7.5 (GLOVE) ×2 IMPLANT
GLOVE BIOGEL PI INDICATOR 7.5 (GLOVE) ×1
GOWN STRL REUS W/ TWL LRG LVL3 (GOWN DISPOSABLE) ×6 IMPLANT
GOWN STRL REUS W/TWL LRG LVL3 (GOWN DISPOSABLE) ×6
HANDLE SUCTION POOLE (INSTRUMENTS) ×2 IMPLANT
KIT BASIN OR (CUSTOM PROCEDURE TRAY) ×3 IMPLANT
KIT TURNOVER KIT B (KITS) ×3 IMPLANT
LIGASURE IMPACT 36 18CM CVD LR (INSTRUMENTS) IMPLANT
NS IRRIG 1000ML POUR BTL (IV SOLUTION) ×6 IMPLANT
PACK GENERAL/GYN (CUSTOM PROCEDURE TRAY) ×3 IMPLANT
PAD ARMBOARD 7.5X6 YLW CONV (MISCELLANEOUS) ×3 IMPLANT
PENCIL SMOKE EVACUATOR (MISCELLANEOUS) ×3 IMPLANT
RELOAD PROXIMATE 75MM BLUE (ENDOMECHANICALS) ×4 IMPLANT
RELOAD STAPLE 75 3.8 BLU REG (ENDOMECHANICALS) IMPLANT
SCISSORS LAP 5X35 DISP (ENDOMECHANICALS) IMPLANT
SET IRRIG TUBING LAPAROSCOPIC (IRRIGATION / IRRIGATOR) IMPLANT
SET TUBE SMOKE EVAC HIGH FLOW (TUBING) ×3 IMPLANT
SLEEVE ENDOPATH XCEL 5M (ENDOMECHANICALS) ×3 IMPLANT
SPECIMEN JAR LARGE (MISCELLANEOUS) IMPLANT
SPONGE T-LAP 18X18 ~~LOC~~+RFID (SPONGE) IMPLANT
STAPLER GUN LINEAR PROX 60 (STAPLE) ×1 IMPLANT
STAPLER PROXIMATE 75MM BLUE (STAPLE) ×1 IMPLANT
STAPLER VISISTAT 35W (STAPLE) ×3 IMPLANT
SUCTION POOLE HANDLE (INSTRUMENTS) ×2
SUT PDS AB 1 TP1 96 (SUTURE) ×6 IMPLANT
SUT SILK 2 0 (SUTURE) ×2
SUT SILK 2 0 SH CR/8 (SUTURE) ×3 IMPLANT
SUT SILK 2-0 18XBRD TIE 12 (SUTURE) ×2 IMPLANT
SUT SILK 3 0 (SUTURE) ×2
SUT SILK 3 0 SH CR/8 (SUTURE) ×3 IMPLANT
SUT SILK 3-0 18XBRD TIE 12 (SUTURE) ×2 IMPLANT
SUT VIC AB 3-0 SH 27 (SUTURE)
SUT VIC AB 3-0 SH 27X BRD (SUTURE) IMPLANT
TOWEL GREEN STERILE (TOWEL DISPOSABLE) ×3 IMPLANT
TOWEL GREEN STERILE FF (TOWEL DISPOSABLE) ×3 IMPLANT
TRAY FOLEY MTR SLVR 14FR STAT (SET/KITS/TRAYS/PACK) IMPLANT
TRAY FOLEY MTR SLVR 16FR STAT (SET/KITS/TRAYS/PACK) ×3 IMPLANT
TRAY LAPAROSCOPIC MC (CUSTOM PROCEDURE TRAY) ×3 IMPLANT
TROCAR XCEL BLUNT TIP 100MML (ENDOMECHANICALS) ×1 IMPLANT
TROCAR XCEL NON-BLD 11X100MML (ENDOMECHANICALS) IMPLANT
TROCAR XCEL NON-BLD 5MMX100MML (ENDOMECHANICALS) ×3 IMPLANT
YANKAUER SUCT BULB TIP NO VENT (SUCTIONS) IMPLANT

## 2021-06-17 NOTE — TOC Initial Note (Signed)
Transition of Care (TOC) - Initial/Assessment Note  ? ? ?Patient Details  ?Name: [name] ?MRN: [id number] ?Date of Birth: [birth year] ? ?Transition of Care [facility]) CM/SW Contact:    ?Ninfa Meeker, RN ?Phone Number: ?06/17/2021, 2:46 PM ? ?Clinical Narrative:                ?Transition of Care Screening Note: ? ? Transition of Care Department [facility]) has reviewed patient and no TOC needs have been identified at this time. We will continue to monitor patient advancement through Interdisciplinary progressions. If new patient transition needs arise, please place a consult.   ?  ? ? ?Patient Goals and CMS Choice ?  ?  ?  ? ?Expected Discharge Plan and Services ?  ?  ?  ?  ?  ?                ?  ?  ?  ?  ?  ?  ?  ?  ?  ?  ? ?Prior Living Arrangements/Services ?  ?  ?  ?       ?  ?  ?  ?  ? ?Activities of Daily Living ?Home Assistive Devices/Equipment: None ?ADL Screening (condition at time of admission) ?Patient's cognitive ability adequate to safely complete daily activities?: Yes ?Is the patient deaf or have difficulty hearing?: No ?Does the patient have difficulty seeing, even when wearing glasses/contacts?: No ?Does the patient have difficulty concentrating, remembering, or making decisions?: No ?Patient able to express need for assistance with ADLs?: Yes ?Does the patient have difficulty dressing or bathing?: No ?Independently performs ADLs?: Yes (appropriate for developmental age) ?Does the patient have difficulty walking or climbing stairs?: No ?Weakness of Legs: None ?Weakness of Arms/Hands: None ? ?Permission Sought/Granted ?  ?  ?   ?   ?   ?   ? ?Emotional Assessment ?  ?  ?  ?  ?  ?  ? ?Admission diagnosis:  Intussusception ([place]) [K56.1] ?Small bowel obstruction ([place]) [K56.609] ?SBO (small bowel obstruction) ([place]) [K56.609] ?Abnormal chest x-ray [R93.89] ?Patient Active Problem List  ? Diagnosis Date Noted  ? SBO (small bowel obstruction) ([place]) 06/16/2021  ? Primary osteoarthritis of first  carpometacarpal joint of right hand 07/01/2016  ? 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 ?PCP:  Princess Bruins, MD ?Pharmacy:   ?[id number] [place], [place] - [address] AT [id number]?Phone: [phone]: [phone]? ?[address]?Phone: [phone]: [phone]? ? ? ? ?Social Determinants of Health (SDOH) Interventions ?  ? ?Readmission Risk Interventions ?   ? View : No data to display.  ?  ?  ?  ? ? ? ?

## 2021-06-17 NOTE — Anesthesia Postprocedure Evaluation (Signed)
Anesthesia Post Note ? ?Patient: [name] ? ?Procedure(s) Performed: LAPAROSCOPY DIAGNOSTIC ?SMALL BOWEL RESECTION ? ?  ? ?Patient location during evaluation: PACU ?Anesthesia Type: General ?Level of consciousness: awake and alert ?Pain management: pain level controlled ?Vital Signs Assessment: post-procedure vital signs reviewed and stable ?Respiratory status: spontaneous breathing, nonlabored ventilation and respiratory function stable ?Cardiovascular status: blood pressure returned to baseline ?Postop Assessment: no apparent nausea or vomiting ?Anesthetic complications: no ? ? ?No notable events documented. ? ?Last Vitals:  ?Vitals:  ? 06/17/21 1038 06/17/21 1053  ?BP: (!) 108/55 (!) 105/55  ?Pulse: (!) 57 (!) 46  ?Resp: 12 15  ?Temp:  36.7 ?C  ?SpO2: 99% 98%  ?  ?Last Pain:  ?Vitals:  ? 06/17/21 1053  ?TempSrc:   ?PainSc: Asleep  ? ? ?  ?  ?  ?  ?  ?  ? ?Marthenia Rolling ? ? ? ? ?

## 2021-06-17 NOTE — Transfer of Care (Signed)
Immediate Anesthesia Transfer of Care Note ? ?Patient: ARIBELLA VAVRA ? ?Procedure(s) Performed: LAPAROSCOPY DIAGNOSTIC ?SMALL BOWEL RESECTION ? ?Patient Location: PACU ? ?Anesthesia Type:General ? ?Level of Consciousness: drowsy ? ?Airway & Oxygen Therapy: Patient Spontanous Breathing and Patient connected to face mask oxygen ? ?Post-op Assessment: Report given to RN and Post -op Vital signs reviewed and stable ? ?Post vital signs: Reviewed and stable ? ?Last Vitals:  ?Vitals Value Taken Time  ?BP 104/60 06/17/21 1008  ?Temp    ?Pulse 53 06/17/21 1008  ?Resp 16 06/17/21 1008  ?SpO2 98 % 06/17/21 1008  ?Vitals shown include unvalidated device data. ? ?Last Pain:  ?Vitals:  ? 06/17/21 0831  ?TempSrc:   ?PainSc: 0-No pain  ?   ? ?Patients Stated Pain Goal: 0 (06/17/21 0831) ? ?Complications: No notable events documented. ?

## 2021-06-17 NOTE — Op Note (Signed)
Preoperative diagnosis: Partial small bowel obstruction with likely small bowel to small bowel intussusception ?Postoperative diagnosis: Intraluminal small bowel mass with intussusception ?Procedure: ?1.  Diagnostic laparoscopy ?2.  Small bowel resection ?Surgeon: Dr. Serita Grammes ?Anesthesia: General ?Estimated blood loss: Minimal ?Specimens: Small bowel with mass and grossly clear margins to pathology ?Complications: None ?Drains: None ?Sponge needle count was correct completion ?Decision recovery stable condition ? ?Indications: This is a 61 year old female with a history of an abdominal wall melanoma who presents with abdominal pain for about 4 weeks.  She was noted on CT scan to have what appeared to be a partial small bowel obstruction associated with an intussusception.  I discussed going to the operating room for diagnostic laparoscopy. ? ?Procedure: After informed consent was obtained the patient was taken to the operating.  She was given antibiotics.  SCDs were in place.  She was placed under general anesthesia without complication.  She was prepped and draped in the standard sterile surgical fashion.  A surgical timeout was then performed. ? ?I then infiltrated Marcaine below the umbilicus.  I made a vertical incision.  I grasped the fascia and incised this sharply.  I entered the peritoneum without injury.  I placed a 0 Vicryl pursestring suture and inserted a Hassan trocar.  I insufflated the abdomen to 15 mmHg pressure.  I then noted dilated small bowel.  I placed two 5 mm trocars in the left side of the abdomen.  I then ran the small bowel and it was clear around the mid small bowel that she had an intussusception.  At that point I remove the laparoscope and made a small midline incision.  I would I eviscerated her and examined her small bowel.  She clearly had an intussusception that I was able to reduce.  There was a mass associated with this.  I then used a GIA stapler to divide the bowel several  centimeters on either side.  I then remove the small portion of mesentery which was not much given her body habitus.  And oversewed this with silk sutures.  This was passed off the table as a specimen.  I then examined her entire small bowel and there were no other masses.  I brought the proximal and distal end into apposition with 3-0 silk sutures.  I then made enterotomies in both.  I evacuated a lot of the contents proximally as it was dilated.  I then used a GIA stapler to create a common enterotomy and create the anastomosis.  This was patent and hemostatic.  I closed the common hole with a TX stapler.  I placed two 3-0 silk stitches at the apex.  The mesenteric defect was closed with silk suture.  I then replaced the bowel into the abdomen.  I closed this with #1 looped PDS.  Staples were used to close the incisions.  A dressing was placed.  She tolerated this well was extubated and transferred to recovery stable. ? ? ? ?

## 2021-06-17 NOTE — ED Notes (Signed)
Patient refused NG tube ordered; pt was explained the process and reason for NG tube; Pt still decline; Receiving RN made aware that patient has declined NG to be placed prior to leaving the ED-Monique,RN  ?

## 2021-06-17 NOTE — Interval H&P Note (Signed)
History and Physical Interval Note: ? ?06/17/2021 ?8:52 AM ? ?[name]  has presented today for surgery, with the diagnosis of intussusception.  The various methods of treatment have been discussed with the patient and family. After consideration of risks, benefits and other options for treatment, the patient has consented to  Procedure(s): ?LAPAROSCOPY DIAGNOSTIC (N/A) ?SMALL BOWEL RESECTION (N/A) as a surgical intervention.  The patient's history has been reviewed, patient examined, no change in status, stable for surgery.  I have reviewed the patient's chart and labs.  Questions were answered to the patient's satisfaction.   ? ? ?Rolm Bookbinder ? ? ?

## 2021-06-17 NOTE — Progress Notes (Signed)
Day of Surgery  ? ?Subjective/Chief Complaint: ?Still with ab pain, little flatus ? ? ?Objective: ?Vital signs in last 24 hours: ?Temp:  [97.6 ?F (36.4 ?C)-98.5 ?F (36.9 ?C)] 98.1 ?F (36.7 ?C) (04/18 0238) ?Pulse Rate:  [43-114] 52 (04/18 0238) ?Resp:  [12-20] 15 (04/18 0238) ?BP: (99-130)/(58-99) 119/58 (04/18 0238) ?SpO2:  [95 %-100 %] 98 % (04/18 0238) ?Weight:  [48.7 kg-51.7 kg] 48.7 kg (04/18 0238) ?  ? ?Intake/Output from previous day: ?04/17 0701 - 04/18 0700 ?In: 1550.1 [IV Piggyback:1550.1] ?Out: -  ?Intake/Output this shift: ?No intake/output data recorded. ? ?GI: soft mild tender ? ?Lab Results:  ?Recent Labs  ?  06/16/21 ?1134 06/17/21 ?0423  ?WBC 7.6 7.5  ?HGB 14.2 11.6*  ?HCT 42.4 35.0*  ?PLT 335 245  ? ?BMET ?Recent Labs  ?  06/16/21 ?1134 06/17/21 ?0423  ?NA 139 139  ?K 3.7 4.1  ?CL 101 108  ?CO2 27 22  ?GLUCOSE 101* 61*  ?BUN 13 10  ?CREATININE 0.97 0.91  ?CALCIUM 10.5* 8.7*  ? ?PT/INR ?No results for input(s): LABPROT, INR in the last 72 hours. ?ABG ?No results for input(s): PHART, HCO3 in the last 72 hours. ? ?Invalid input(s): PCO2, PO2 ? ?Studies/Results: ?CT ABDOMEN PELVIS W CONTRAST ? ?Result Date: 06/16/2021 ?CLINICAL DATA:  Abdominal pain, acute, nonlocalized EXAM: CT ABDOMEN AND PELVIS WITH CONTRAST TECHNIQUE: Multidetector CT imaging of the abdomen and pelvis was performed using the standard protocol following bolus administration of intravenous contrast. RADIATION DOSE REDUCTION: This exam was performed according to the departmental dose-optimization program which includes automated exposure control, adjustment of the mA and/or kV according to patient size and/or use of iterative reconstruction technique. CONTRAST:  81m OMNIPAQUE IOHEXOL 300 MG/ML  SOLN COMPARISON:  CT 03/16/2007 FINDINGS: Lower chest: Partially imaged dense airspace consolidation in the periphery of the right middle lobe (series 4, image 1). Normal heart size. Hepatobiliary: Small stone within the dependent aspect of the  gallbladder. No wall thickening or pericholecystic inflammatory changes evident by CT. No focal liver lesion. Pancreas: Unremarkable. No pancreatic ductal dilatation or surrounding inflammatory changes. Spleen: Normal in size without focal abnormality. Adrenals/Urinary Tract: Adrenal glands are unremarkable. Kidneys are normal, without renal calculi, focal lesion, or hydronephrosis. Urinary bladder is decompressed. Stomach/Bowel: Small bowel-small bowel intussusception within the right lower quadrant (series 2, images 49-59). Small-bowel obstruction upstream from the site of intussusception. Small bowel loops dilated up to 4.3 cm with air-fluid levels. Colon is decompressed. Stomach within normal limits. Vascular/Lymphatic: No significant vascular findings are present. No enlarged abdominal or pelvic lymph nodes. Reproductive: Status post hysterectomy. No adnexal masses. Other: No free fluid. No abdominopelvic fluid collection. No pneumoperitoneum. No abdominal wall hernia. Musculoskeletal: No acute or significant osseous findings. IMPRESSION: 1. Small bowel-small bowel intussusception within the right lower quadrant with associated small bowel obstruction upstream from the site of intussusception. 2. Partially imaged dense airspace consolidation in the periphery of the right middle lobe, suspicious for pneumonia. Radiographic follow-up to resolution is recommended. 3. Cholelithiasis without evidence of acute cholecystitis. Electronically Signed   By: NDavina PokeD.O.   On: 06/16/2021 12:58  ? ?DG Chest Portable 1 View ? ?Result Date: 06/16/2021 ?CLINICAL DATA:  Abnormal CT EXAM: PORTABLE CHEST 1 VIEW COMPARISON:  None. FINDINGS: Cardiac and mediastinal contours are within normal limits. Right middle lobe nodular opacity. Lungs otherwise clear. No pleural effusion or pneumothorax. IMPRESSION: Right middle nodular opacity which correlates with finding on same day CT of the abdomen and pelvis. Recommend follow-up  chest x-ray in 3-4 weeks to ensure resolution, if finding persists, CT of the chest should be performed. Electronically Signed   By: Yetta Glassman M.D.   On: 06/16/2021 13:45   ? ?Anti-infectives: ?Anti-infectives (From admission, onward)  ? ? None  ? ?  ? ? ?Assessment/Plan: ?Dx lsc, likely sbr today ? ? ? ?[name] ?06/17/2021 ? ?

## 2021-06-17 NOTE — Anesthesia Procedure Notes (Signed)
Procedure Name: Intubation ?Date/Time: 06/17/2021 9:07 AM ?Performed by: Gwyndolyn Saxon, CRNA ?Pre-anesthesia Checklist: Patient identified, Emergency Drugs available, Suction available and Patient being monitored ?Patient Re-evaluated:Patient Re-evaluated prior to induction ?Oxygen Delivery Method: Circle system utilized ?Preoxygenation: Pre-oxygenation with 100% oxygen ?Induction Type: IV induction and Rapid sequence ?Laryngoscope Size: Sabra Heck and 2 ?Grade View: Grade I ?Tube type: Oral ?Tube size: 6.5 mm ?Number of attempts: 1 ?Airway Equipment and Method: Stylet ?Placement Confirmation: ETT inserted through vocal cords under direct vision, positive ETCO2 and breath sounds checked- equal and bilateral ?Secured at: 21 cm ?Tube secured with: Tape ?Dental Injury: Teeth and Oropharynx as per pre-operative assessment  ? ? ? ? ?

## 2021-06-17 NOTE — Anesthesia Preprocedure Evaluation (Addendum)
Anesthesia Evaluation  ?Patient identified by MRN, date of birth, ID band ?Patient awake ? ? ? ?Reviewed: ?Allergy & Precautions, NPO status , Patient's Chart, lab work & pertinent test results ? ?History of Anesthesia Complications ?Negative for: history of anesthetic complications ? ?Airway ?Mallampati: II ? ?TM Distance: >3 FB ?Neck ROM: Full ? ? ? Dental ? ?(+) Missing,  ?  ?Pulmonary ?neg pulmonary ROS,  ?  ?Pulmonary exam normal ? ? ? ? ? ? ? Cardiovascular ?negative cardio ROS ?Normal cardiovascular exam ? ? ?  ?Neuro/Psych ?negative neurological ROS ? negative psych ROS  ? GI/Hepatic ?Neg liver ROS, intussusception ?  ?Endo/Other  ?negative endocrine ROS ? Renal/GU ?negative Renal ROS  ?negative genitourinary ?  ?Musculoskeletal ? ?(+) Arthritis ,  ? Abdominal ?  ?Peds ? Hematology ?negative hematology ROS ?(+)   ?Anesthesia Other Findings ?Day of surgery medications reviewed with patient. ? Reproductive/Obstetrics ?negative OB ROS ? ?  ? ? ? ? ? ? ? ? ? ? ? ? ? ?  ?  ? ? ? ? ? ? ? ?Anesthesia Physical ?Anesthesia Plan ? ?ASA: 2 ? ?Anesthesia Plan: General  ? ?Post-op Pain Management: Ofirmev IV (intra-op)*  ? ?Induction: Intravenous and Rapid sequence ? ?PONV Risk Score and Plan: 4 or greater and Scopolamine patch - Pre-op, Midazolam, Treatment may vary due to age or medical condition, Dexamethasone and Ondansetron ? ?Airway Management Planned: Oral ETT ? ?Additional Equipment: None ? ?Intra-op Plan:  ? ?Post-operative Plan: Extubation in OR ? ?Informed Consent: I have reviewed the patients History and Physical, chart, labs and discussed the procedure including the risks, benefits and alternatives for the proposed anesthesia with the patient or authorized representative who has indicated his/her understanding and acceptance.  ? ? ? ?Dental advisory given ? ?Plan Discussed with: CRNA ? ?Anesthesia Plan Comments:   ? ? ? ? ? ?Anesthesia Quick Evaluation ? ?

## 2021-06-18 ENCOUNTER — Encounter (HOSPITAL_COMMUNITY): Payer: Self-pay | Admitting: General Surgery

## 2021-06-18 LAB — BASIC METABOLIC PANEL
Anion gap: 9 (ref 5–15)
BUN: 9 mg/dL (ref 8–23)
CO2: 20 mmol/L — ABNORMAL LOW (ref 22–32)
Calcium: 8.5 mg/dL — ABNORMAL LOW (ref 8.9–10.3)
Chloride: 108 mmol/L (ref 98–111)
Creatinine, Ser: 0.95 mg/dL (ref 0.44–1.00)
GFR, Estimated: >60 mL/min (ref >60)
Glucose, Bld: 86 mg/dL (ref 70–99)
Potassium: 3.9 mmol/L (ref 3.5–5.1)
Sodium: 137 mmol/L (ref 135–145)

## 2021-06-18 MED ORDER — ACETAMINOPHEN 500 MG PO TABS
1000.0000 mg | ORAL_TABLET | Freq: Three times a day (TID) | ORAL | Status: DC
  Administered 2021-06-18 – 2021-06-20 (×7): 1000 mg via ORAL
  Filled 2021-06-18 (×7): qty 2

## 2021-06-18 MED ORDER — METHOCARBAMOL 1000 MG/10ML IJ SOLN
500.0000 mg | Freq: Four times a day (QID) | INTRAVENOUS | Status: DC | PRN
  Filled 2021-06-18: qty 5

## 2021-06-18 MED ORDER — KETOROLAC TROMETHAMINE 15 MG/ML IJ SOLN
15.0000 mg | Freq: Four times a day (QID) | INTRAMUSCULAR | Status: DC
  Administered 2021-06-18 – 2021-06-20 (×8): 15 mg via INTRAVENOUS
  Filled 2021-06-18 (×8): qty 1

## 2021-06-18 NOTE — Progress Notes (Signed)
[facility] ?Progress Note ? ?1 Day Post-Op  ?Subjective: ?CC-  ?Husband at bedside. ?Abdomen very sore this morning, pain medication helps. Had some nausea with morphine earlier but that is better. No emesis. No flatus or BM. ?Ambulated to restroom without issues. ? ?Objective: ?Vital signs in last 24 hours: ?Temp:  [98 ?F (36.7 ?C)-99.6 ?F (37.6 ?C)] 98.3 ?F (36.8 ?C) (04/19 0818) ?Pulse Rate:  [46-62] 60 (04/19 0818) ?Resp:  [12-17] 16 (04/19 0818) ?BP: (97-108)/(55-67) 100/61 (04/19 0818) ?SpO2:  [94 %-99 %] 94 % (04/19 0818) ?  ? ?Intake/Output from previous day: ?04/18 0701 - 04/19 0700 ?In: 1990.6 [I.V.:1840.6; IV Piggyback:150] ?Out: 105 [Urine:100; Blood:5] ?Intake/Output this shift: ?No intake/output data recorded. ? ?PE: ?Gen:  Alert, NAD, pleasant ?Abd: soft, mild distension, appropriately tender, honeycomb to midline with small amount of dried blood, few BS heard ? ?Lab Results:  ?Recent Labs  ?  06/16/21 ?1134 06/17/21 ?0423  ?WBC 7.6 7.5  ?HGB 14.2 11.6*  ?HCT 42.4 35.0*  ?PLT 335 245  ? ?BMET ?Recent Labs  ?  06/17/21 ?0423 06/18/21 ?0138  ?NA 139 137  ?K 4.1 3.9  ?CL 108 108  ?CO2 22 20*  ?GLUCOSE 61* 86  ?BUN 10 9  ?CREATININE 0.91 0.95  ?CALCIUM 8.7* 8.5*  ? ?PT/INR ?No results for input(s): LABPROT, INR in the last 72 hours. ?CMP  ?   ?Component Value Date/Time  ? NA 137 06/18/2021 0138  ? K 3.9 06/18/2021 0138  ? CL 108 06/18/2021 0138  ? CO2 20 (L) 06/18/2021 0138  ? GLUCOSE 86 06/18/2021 0138  ? BUN 9 06/18/2021 0138  ? CREATININE 0.95 06/18/2021 0138  ? CALCIUM 8.5 (L) 06/18/2021 0138  ? PROT 7.6 06/16/2021 1134  ? ALBUMIN 4.8 06/16/2021 1134  ? AST 28 06/16/2021 1134  ? ALT 22 06/16/2021 1134  ? ALKPHOS 77 06/16/2021 1134  ? BILITOT 0.7 06/16/2021 1134  ? GFRNONAA >60 06/18/2021 0138  ? ?Lipase  ?   ?Component Value Date/Time  ? LIPASE 28 06/16/2021 1134  ? ? ? ? ? ?Studies/Results: ?CT ABDOMEN PELVIS W CONTRAST ? ?Result Date: 06/16/2021 ?CLINICAL DATA:  Abdominal pain, acute,  nonlocalized EXAM: CT ABDOMEN AND PELVIS WITH CONTRAST TECHNIQUE: Multidetector CT imaging of the abdomen and pelvis was performed using the standard protocol following bolus administration of intravenous contrast. RADIATION DOSE REDUCTION: This exam was performed according to the departmental dose-optimization program which includes automated exposure control, adjustment of the mA and/or kV according to patient size and/or use of iterative reconstruction technique. CONTRAST:  41m OMNIPAQUE IOHEXOL 300 MG/ML  SOLN COMPARISON:  CT 03/16/2007 FINDINGS: Lower chest: Partially imaged dense airspace consolidation in the periphery of the right middle lobe (series 4, image 1). Normal heart size. Hepatobiliary: Small stone within the dependent aspect of the gallbladder. No wall thickening or pericholecystic inflammatory changes evident by CT. No focal liver lesion. Pancreas: Unremarkable. No pancreatic ductal dilatation or surrounding inflammatory changes. Spleen: Normal in size without focal abnormality. Adrenals/Urinary Tract: Adrenal glands are unremarkable. Kidneys are normal, without renal calculi, focal lesion, or hydronephrosis. Urinary bladder is decompressed. Stomach/Bowel: Small bowel-small bowel intussusception within the right lower quadrant (series 2, images 49-59). Small-bowel obstruction upstream from the site of intussusception. Small bowel loops dilated up to 4.3 cm with air-fluid levels. Colon is decompressed. Stomach within normal limits. Vascular/Lymphatic: No significant vascular findings are present. No enlarged abdominal or pelvic lymph nodes. Reproductive: Status post hysterectomy. No adnexal masses. Other: No free fluid. No  abdominopelvic fluid collection. No pneumoperitoneum. No abdominal wall hernia. Musculoskeletal: No acute or significant osseous findings. IMPRESSION: 1. Small bowel-small bowel intussusception within the right lower quadrant with associated small bowel obstruction upstream from  the site of intussusception. 2. Partially imaged dense airspace consolidation in the periphery of the right middle lobe, suspicious for pneumonia. Radiographic follow-up to resolution is recommended. 3. Cholelithiasis without evidence of acute cholecystitis. Electronically Signed   By: Davina Poke D.O.   On: 06/16/2021 12:58  ? ?DG Chest Portable 1 View ? ?Result Date: 06/16/2021 ?CLINICAL DATA:  Abnormal CT EXAM: PORTABLE CHEST 1 VIEW COMPARISON:  None. FINDINGS: Cardiac and mediastinal contours are within normal limits. Right middle lobe nodular opacity. Lungs otherwise clear. No pleural effusion or pneumothorax. IMPRESSION: Right middle nodular opacity which correlates with finding on same day CT of the abdomen and pelvis. Recommend follow-up chest x-ray in 3-4 weeks to ensure resolution, if finding persists, CT of the chest should be performed. Electronically Signed   By: Yetta Glassman M.D.   On: 06/16/2021 13:45   ? ?Anti-infectives: ?Anti-infectives (From admission, onward)  ? ? Start     Dose/Rate Route Frequency Ordered Stop  ? 06/17/21 0855  ceFAZolin (ANCEF) 2-4 GM/100ML-% IVPB       ?Note to Pharmacy: Nicholes Calamity: cabinet override  ?    06/17/21 0855 06/17/21 2059  ? ?  ? ? ? ?Assessment/Plan ?Intraluminal small bowel mass with intussusception ?-POD#1 s/p Diagnostic laparoscopy, Small bowel resection 4/18 Dr. Donne Hazel ?- path pending ?- ok for clear liquid diet, await return in bowel function prior to advancing ?- mobilize ?- schedule tylenol and toradol for better pain control ? ?ID - ancef periop ?FEN - IVF, CLD ?VTE - lovenox ?Foley - none ? ? ? LOS: 2 days  ? ? ?Wellington Hampshire, PA-C ?[facility] ?06/18/2021, 9:40 AM ?Please see Amion for pager number during day hours 7:00am-4:30pm ? ?

## 2021-06-18 NOTE — Plan of Care (Signed)

## 2021-06-18 NOTE — Plan of Care (Signed)
?  Problem: Pain Managment: ?Goal: General experience of comfort will improve ?Outcome: Progressing ?  ?Problem: Clinical Measurements: ?Goal: Cardiovascular complication will be avoided ?Outcome: Progressing ?  ?Problem: Clinical Measurements: ?Goal: Respiratory complications will improve ?Outcome: Progressing ?  ?Problem: Clinical Measurements: ?Goal: Will remain free from infection ?Outcome: Progressing ?  ?Problem: Education: ?Goal: Knowledge of General Education information will improve ?Description: Including pain rating scale, medication(s)/side effects and non-pharmacologic comfort measures ?Outcome: Progressing ?  ?Problem: Clinical Measurements: ?Goal: Ability to maintain clinical measurements within normal limits will improve ?Outcome: Progressing ?  ?

## 2021-06-19 LAB — SURGICAL PATHOLOGY

## 2021-06-19 LAB — BASIC METABOLIC PANEL
Anion gap: 6 (ref 5–15)
BUN: 10 mg/dL (ref 8–23)
CO2: 23 mmol/L (ref 22–32)
Calcium: 8.2 mg/dL — ABNORMAL LOW (ref 8.9–10.3)
Chloride: 106 mmol/L (ref 98–111)
Creatinine, Ser: 0.85 mg/dL (ref 0.44–1.00)
GFR, Estimated: >60 mL/min (ref >60)
Glucose, Bld: 75 mg/dL (ref 70–99)
Potassium: 3.6 mmol/L (ref 3.5–5.1)
Sodium: 135 mmol/L (ref 135–145)

## 2021-06-19 LAB — MAGNESIUM: Magnesium: 1.9 mg/dL (ref 1.7–2.4)

## 2021-06-19 MED ORDER — MORPHINE SULFATE (PF) 2 MG/ML IV SOLN: 1.0000 mg | INTRAVENOUS | Status: DC | PRN

## 2021-06-19 MED ORDER — TRAMADOL HCL 50 MG PO TABS
50.0000 mg | ORAL_TABLET | Freq: Four times a day (QID) | ORAL | Status: DC | PRN
  Administered 2021-06-19: 50 mg via ORAL
  Filled 2021-06-19: qty 1

## 2021-06-19 MED ORDER — METHOCARBAMOL 500 MG PO TABS: 500.0000 mg | ORAL_TABLET | Freq: Three times a day (TID) | ORAL | Status: DC | PRN

## 2021-06-19 NOTE — Progress Notes (Addendum)
2 Days Post-Op  ? ?Subjective/Chief Complaint: ?Having flatus, pain controlled, up in room, tol clears ? ? ?Objective: ?Vital signs in last 24 hours: ?Temp:  [97.6 ?F (36.4 ?C)-98.5 ?F (36.9 ?C)] 97.6 ?F (36.4 ?C) (04/20 0347) ?Pulse Rate:  [51-62] 52 (04/20 0817) ?Resp:  [16-18] 16 (04/20 0817) ?BP: (93-108)/(54-68) 99/61 (04/20 4259) ?SpO2:  [94 %-98 %] 98 % (04/20 0817) ?  ? ?Intake/Output from previous day: ?No intake/output data recorded. ?Intake/Output this shift: ?No intake/output data recorded. ? ?GI: soft approp tender dressing clean ? ?Lab Results:  ?Recent Labs  ?  06/16/21 ?1134 06/17/21 ?0423  ?WBC 7.6 7.5  ?HGB 14.2 11.6*  ?HCT 42.4 35.0*  ?PLT 335 245  ? ?BMET ?Recent Labs  ?  06/18/21 ?0138 06/19/21 ?0032  ?NA 137 135  ?K 3.9 3.6  ?CL 108 106  ?CO2 20* 23  ?GLUCOSE 86 75  ?BUN 9 10  ?CREATININE 0.95 0.85  ?CALCIUM 8.5* 8.2*  ? ?PT/INR ?No results for input(s): LABPROT, INR in the last 72 hours. ?ABG ?No results for input(s): PHART, HCO3 in the last 72 hours. ? ?Invalid input(s): PCO2, PO2 ? ?Studies/Results: ?No results found. ? ?Anti-infectives: ?Anti-infectives (From admission, onward)  ? ? Start     Dose/Rate Route Frequency Ordered Stop  ? 06/17/21 0855  ceFAZolin (ANCEF) 2-4 GM/100ML-% IVPB       ?Note to Pharmacy: Nicholes Calamity: cabinet override  ?    06/17/21 0855 06/17/21 2059  ? ?  ? ? ?Assessment/Plan: ?Intraluminal small bowel mass with intussusception ?-POD#2  s/p Diagnostic laparoscopy, Small bowel resection 4/18 Dr. Donne Hazel ?- path pending ?- fulls, adat ?- mobilize, pulm toilet, needs to walk halls today ?- schedule tylenol and toradol , oral pain meds ?--possible dc in am if doing well ?  ?ID - ancef periop ?FEN - IVF, fulls ?VTE - lovenox ?Foley - none ?  ? ?Addendum: path is metastatic melanoma, discussed with patient, will refer to [facility] oncology as outpatient ? ? ?Rolm Bookbinder ?06/19/2021 ? ?

## 2021-06-19 NOTE — Discharge Summary (Signed)
[facility] ?Discharge Summary  ? ?Patient ID: ?[name] ?MRN: [id number] ?DOB/AGE: [birth year] 61 y.o. ? ?Admit date: 06/16/2021 ?Discharge date: 06/20/2021 ? ?Admitting Diagnosis: ?Partial small bowel obstruction with likely small bowel to small bowel intussusception ? 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 ?Consultants ?None ? ?Imaging: ?No results found. ? ?Procedures ?Dr. Donne Hazel (06/17/2021) - Diagnostic laparoscopy, small bowel resection ? ?Hospital Course:  ?[name] is a 61yo female with history of abdominal wall melanoma and hysterectomy who presents with over four weeks of abdominal pain, not eating as much, less flatus, having BMs, some nausea, no emesis.  This has been worsening and not getting better with anything at home.  She presented to outside ER and was found on CT scan to have small bowel-small bowel intussusception. She was transferred to [facility] for evaluation by the surgical service. Patient was admitted for hydration, NG decompression, and resuscitation overnight. The following day she was taken to the OR for diagnostic laparoscopy. Intraoperatively she was found to have intraluminal small bowel mass with intussusception. A small bowel resection was performed. Patient tolerated procedure well and was transferred to the floor.  Once bowel function returned diet was advanced as tolerated.  Pathology returned and consistent with metastatic melanoma to the small intestine; she will be referred to oncology at [facility] for further management.  ?On POD3, the patient was voiding well, tolerating diet, ambulating well, pain well controlled, vital signs stable, incisions c/d/i and felt stable for discharge home.  Patient will follow up as below and knows to call with questions or concerns.   ? ?I have personally reviewed the patients medication history on the [place] controlled substance database.   ? ? ?Physical Exam: ?Gen:  Alert, NAD, pleasant ?Abd: soft, nondistended, appropriately tender, honeycomb to midline with small amount of dried blood ? ?Allergies as of 06/20/2021   ?No Known Allergies ?  ? ?  ?Medication List  ?  ? ?TAKE these medications   ? ?acetaminophen 500 MG tablet ?Commonly known as: TYLENOL ?Take 2 tablets (1,000 mg total) by mouth every 8 (eight) hours as needed for mild pain or moderate pain. ?  ?ibuprofen 600 MG tablet ?Commonly known as: ADVIL ?Take 1 tablet (600 mg total) by mouth every 6 (six) hours as needed for headache, mild pain or moderate pain. ?  ?MAGNESIUM CITRATE PO ?Take 1 tablet by mouth every evening. ?  ?methocarbamol 500 MG tablet ?Commonly known as: ROBAXIN ?Take 1 tablet (500 mg total) by mouth every 8 (eight) hours as needed for up to 3 days for muscle spasms. ?  ?traMADol 50 MG tablet ?Commonly known as: ULTRAM ?Take 1 tablet (50 mg total) by mouth every 6 (six) hours as needed for up to 5 days for moderate pain or severe pain. ?  ? ?  ? ? ? ? Follow-up Information   ? ? Princess Bruins, MD. Schedule an appointment as soon as possible for a visit in 2 days.   ?Specialty: Obstetrics and Gynecology ?Why: Follow up from ER visit ?Contact information: ?[address] ?[place] [address] ?[phone] ? ? ?  ?  ? ? Rolm Bookbinder, MD Follow up on 07/04/2021.   ?Specialty: General Surgery ?Why: 1110 appt, arrive at 1055 ?Contact information: ?[address] ?[address] ?[place] [address] ?[phone] ? ? ?  ?  ? ? [facility], [place]. Go on 06/30/2021.   ?Specialty: General Surgery ?  Why: Your appointment is 5/1 at 9am for staple removal ?Please arrive 30 minutes prior to your appointment to check in and fill out paperwork. Bring photo ID and insurance information. ?Contact information: ?[address] ?[address] ?[place] ?[phone] ? ?  ?  ? ?  ?  ? ?  ? ? ? ?Signed: ?Richard Miu, PA-C ?[facility] ?06/20/2021, 3:59 PM ?Please see Amion for pager number during day hours 7:00am-4:30pm ? ?

## 2021-06-19 NOTE — Plan of Care (Signed)

## 2021-06-20 ENCOUNTER — Other Ambulatory Visit (HOSPITAL_COMMUNITY): Payer: Self-pay

## 2021-06-20 MED ORDER — TRAMADOL HCL 50 MG PO TABS
50.0000 mg | ORAL_TABLET | Freq: Four times a day (QID) | ORAL | 0 refills | Status: AC | PRN
  Filled 2021-06-20: qty 20, 5d supply, fill #0

## 2021-06-20 MED ORDER — ACETAMINOPHEN 500 MG PO TABS: 1000.0000 mg | ORAL_TABLET | Freq: Three times a day (TID) | ORAL | Status: AC | PRN

## 2021-06-20 MED ORDER — IBUPROFEN 600 MG PO TABS
600.0000 mg | ORAL_TABLET | Freq: Four times a day (QID) | ORAL | Status: DC | PRN
Start: 2021-06-20 — End: 2021-06-20

## 2021-06-20 MED ORDER — IBUPROFEN 600 MG PO TABS: 600.0000 mg | ORAL_TABLET | Freq: Four times a day (QID) | ORAL | Status: AC | PRN

## 2021-06-20 MED ORDER — METHOCARBAMOL 500 MG PO TABS
500.0000 mg | ORAL_TABLET | Freq: Three times a day (TID) | ORAL | 0 refills | Status: AC | PRN
  Filled 2021-06-20: qty 9, 3d supply, fill #0

## 2021-06-20 NOTE — Progress Notes (Signed)
3 Days Post-Op  ? ?Subjective/Chief Complaint: ?Passing flatus, tol liquids, one episode emesis small volume voiding, oob ? ? ?Objective: ?Vital signs in last 24 hours: ?Temp:  [97.8 ?F (36.6 ?C)-98.5 ?F (36.9 ?C)] 97.8 ?F (36.6 ?C) (04/21 3546) ?Pulse Rate:  [47-59] 57 (04/21 0752) ?Resp:  [16] 16 (04/21 0752) ?BP: (102-111)/(55-71) 102/58 (04/21 0752) ?SpO2:  [95 %-98 %] 98 % (04/21 0752) ?  ? ?Intake/Output from previous day: ?No intake/output data recorded. ?Intake/Output this shift: ?No intake/output data recorded. ? ?GI: soft dressing dry approp tender ? ?Lab Results:  ?No results for input(s): WBC, HGB, HCT, PLT in the last 72 hours. ?BMET ?Recent Labs  ?  06/18/21 ?0138 06/19/21 ?0032  ?NA 137 135  ?K 3.9 3.6  ?CL 108 106  ?CO2 20* 23  ?GLUCOSE 86 75  ?BUN 9 10  ?CREATININE 0.95 0.85  ?CALCIUM 8.5* 8.2*  ? ?PT/INR ?No results for input(s): LABPROT, INR in the last 72 hours. ?ABG ?No results for input(s): PHART, HCO3 in the last 72 hours. ? ?Invalid input(s): PCO2, PO2 ? ?Studies/Results: ?No results found. ? ?Anti-infectives: ?Anti-infectives (From admission, onward)  ? ? Start     Dose/Rate Route Frequency Ordered Stop  ? 06/17/21 0855  ceFAZolin (ANCEF) 2-4 GM/100ML-% IVPB       ?Note to Pharmacy: Nicholes Calamity: cabinet override  ?    06/17/21 0855 06/17/21 2059  ? ?  ? ? ?Assessment/Plan: ?Intraluminal small bowel mass with intussusception ?-POD#3  s/p Diagnostic laparoscopy, Small bowel resection 4/18 Dr. Donne Hazel ?- path is melanoma, will refer to [facility]- she has been seen there before ?- soft diet ?- mobilize, pulm toilet,  ?- oral pain meds ?--possible dc later today ?  ?ID - ancef periop ?FEN - soft ?VTE - lovenox ?Foley - none ?  ?Rolm Bookbinder ?06/20/2021 ? ?

## 2021-06-26 ENCOUNTER — Ambulatory Visit: Payer: Self-pay | Admitting: Physician Assistant

## 2021-06-30 ENCOUNTER — Other Ambulatory Visit: Payer: Self-pay | Admitting: General Surgery

## 2021-06-30 DIAGNOSIS — Z9889 Other specified postprocedural states: Secondary | ICD-10-CM

## 2021-06-30 DIAGNOSIS — C4359 Malignant melanoma of other part of trunk: Secondary | ICD-10-CM | POA: Diagnosis not present

## 2021-06-30 MED ORDER — ONDANSETRON HCL 4 MG PO TABS: 4.0000 mg | ORAL_TABLET | Freq: Three times a day (TID) | ORAL | 0 refills | Status: AC | PRN

## 2021-06-30 MED ORDER — ONDANSETRON HCL 4 MG PO TABS: 4.0000 mg | ORAL_TABLET | Freq: Three times a day (TID) | ORAL | 1 refills | Status: DC | PRN

## 2021-07-01 DIAGNOSIS — C4359 Malignant melanoma of other part of trunk: Secondary | ICD-10-CM | POA: Diagnosis not present

## 2021-07-01 DIAGNOSIS — K56609 Unspecified intestinal obstruction, unspecified as to partial versus complete obstruction: Secondary | ICD-10-CM | POA: Diagnosis not present

## 2021-07-01 DIAGNOSIS — C439 Malignant melanoma of skin, unspecified: Secondary | ICD-10-CM | POA: Diagnosis not present

## 2021-07-12 ENCOUNTER — Emergency Department (HOSPITAL_BASED_OUTPATIENT_CLINIC_OR_DEPARTMENT_OTHER): Payer: BC Managed Care – PPO | Admitting: Radiology

## 2021-07-12 ENCOUNTER — Emergency Department (HOSPITAL_BASED_OUTPATIENT_CLINIC_OR_DEPARTMENT_OTHER)
Admission: EM | Admit: 2021-07-12 | Discharge: 2021-07-12 | Disposition: A | Discharge status: Home / Self Care | Payer: BC Managed Care – PPO | Attending: Emergency Medicine | Admitting: Emergency Medicine

## 2021-07-12 ENCOUNTER — Encounter (HOSPITAL_BASED_OUTPATIENT_CLINIC_OR_DEPARTMENT_OTHER): Payer: Self-pay | Admitting: Emergency Medicine

## 2021-07-12 ENCOUNTER — Other Ambulatory Visit: Payer: Self-pay

## 2021-07-12 ENCOUNTER — Emergency Department (HOSPITAL_BASED_OUTPATIENT_CLINIC_OR_DEPARTMENT_OTHER): Payer: BC Managed Care – PPO

## 2021-07-12 DIAGNOSIS — C7801 Secondary malignant neoplasm of right lung: Secondary | ICD-10-CM | POA: Diagnosis not present

## 2021-07-12 DIAGNOSIS — J90 Pleural effusion, not elsewhere classified: Secondary | ICD-10-CM | POA: Diagnosis not present

## 2021-07-12 DIAGNOSIS — I3139 Other pericardial effusion (noninflammatory): Secondary | ICD-10-CM | POA: Diagnosis not present

## 2021-07-12 DIAGNOSIS — J189 Pneumonia, unspecified organism: Secondary | ICD-10-CM | POA: Insufficient documentation

## 2021-07-12 DIAGNOSIS — C3490 Malignant neoplasm of unspecified part of unspecified bronchus or lung: Secondary | ICD-10-CM | POA: Diagnosis not present

## 2021-07-12 DIAGNOSIS — R079 Chest pain, unspecified: Secondary | ICD-10-CM | POA: Diagnosis not present

## 2021-07-12 DIAGNOSIS — D72829 Elevated white blood cell count, unspecified: Secondary | ICD-10-CM | POA: Diagnosis not present

## 2021-07-12 DIAGNOSIS — R0789 Other chest pain: Secondary | ICD-10-CM | POA: Diagnosis not present

## 2021-07-12 LAB — BASIC METABOLIC PANEL
Anion gap: 11 (ref 5–15)
BUN: 11 mg/dL (ref 8–23)
CO2: 30 mmol/L (ref 22–32)
Calcium: 9.4 mg/dL (ref 8.9–10.3)
Chloride: 96 mmol/L — ABNORMAL LOW (ref 98–111)
Creatinine, Ser: 0.81 mg/dL (ref 0.44–1.00)
GFR, Estimated: >60 mL/min (ref >60)
Glucose, Bld: 96 mg/dL (ref 70–99)
Potassium: 3.8 mmol/L (ref 3.5–5.1)
Sodium: 137 mmol/L (ref 135–145)

## 2021-07-12 LAB — CBC
HCT: 36.7 % (ref 36.0–46.0)
Hemoglobin: 11.7 g/dL — ABNORMAL LOW (ref 12.0–15.0)
MCH: 28.8 pg (ref 26.0–34.0)
MCHC: 31.9 g/dL (ref 30.0–36.0)
MCV: 90.4 fL (ref 80.0–100.0)
Platelets: 371 10*3/uL (ref 150–400)
RBC: 4.06 MIL/uL (ref 3.87–5.11)
RDW: 14.3 % (ref 11.5–15.5)
WBC: 12.3 10*3/uL — ABNORMAL HIGH (ref 4.0–10.5)
nRBC: 0 % (ref 0.0–0.2)

## 2021-07-12 LAB — HEPATIC FUNCTION PANEL
ALT: 14 U/L (ref 0–44)
AST: 12 U/L — ABNORMAL LOW (ref 15–41)
Albumin: 3.7 g/dL (ref 3.5–5.0)
Alkaline Phosphatase: 78 U/L (ref 38–126)
Bilirubin, Direct: 0.2 mg/dL (ref 0.0–0.2)
Indirect Bilirubin: 0.3 mg/dL (ref 0.3–0.9)
Total Bilirubin: 0.5 mg/dL (ref 0.3–1.2)
Total Protein: 6.7 g/dL (ref 6.5–8.1)

## 2021-07-12 LAB — TROPONIN I (HIGH SENSITIVITY): Troponin I (High Sensitivity): 2 ng/L (ref <18)

## 2021-07-12 LAB — LIPASE, BLOOD: Lipase: 24 U/L (ref 11–51)

## 2021-07-12 MED ORDER — AZITHROMYCIN 250 MG PO TABS
500.0000 mg | ORAL_TABLET | Freq: Once | ORAL | Status: AC
  Administered 2021-07-12: 500 mg via ORAL
  Filled 2021-07-12: qty 2

## 2021-07-12 MED ORDER — IOHEXOL 350 MG/ML SOLN
100.0000 mL | Freq: Once | INTRAVENOUS | Status: AC | PRN
Start: 2021-07-12 — End: 2021-07-12
  Administered 2021-07-12: 100 mL via INTRAVENOUS

## 2021-07-12 MED ORDER — AMOXICILLIN-POT CLAVULANATE 875-125 MG PO TABS: 1.0000 | ORAL_TABLET | Freq: Two times a day (BID) | ORAL | 0 refills | Status: AC

## 2021-07-12 MED ORDER — OXYCODONE-ACETAMINOPHEN 5-325 MG PO TABS: 1.0000 | ORAL_TABLET | Freq: Four times a day (QID) | ORAL | 0 refills | Status: AC | PRN

## 2021-07-12 MED ORDER — TRAMADOL HCL 50 MG PO TABS: ORAL_TABLET | ORAL | 0 refills | Status: AC

## 2021-07-12 MED ORDER — CEFTRIAXONE SODIUM 1 G IJ SOLR
1.0000 g | Freq: Once | INTRAMUSCULAR | Status: AC
  Administered 2021-07-12: 1 g via INTRAVENOUS
  Filled 2021-07-12: qty 10

## 2021-07-12 MED ORDER — LACTATED RINGERS IV SOLN: INTRAVENOUS | Status: DC

## 2021-07-12 MED ORDER — AZITHROMYCIN 250 MG PO TABS: 250.0000 mg | ORAL_TABLET | Freq: Every day | ORAL | 0 refills | Status: AC

## 2021-07-12 MED ORDER — ONDANSETRON HCL 4 MG/2ML IJ SOLN
4.0000 mg | Freq: Once | INTRAMUSCULAR | Status: AC
  Administered 2021-07-12: 4 mg via INTRAVENOUS
  Filled 2021-07-12: qty 2

## 2021-07-12 MED ORDER — HYDROMORPHONE HCL 1 MG/ML IJ SOLN
0.5000 mg | INTRAMUSCULAR | Status: DC | PRN
  Administered 2021-07-12: 0.5 mg via INTRAVENOUS
  Filled 2021-07-12: qty 1

## 2021-07-12 MED ORDER — OXYCODONE-ACETAMINOPHEN 5-325 MG PO TABS: 1.0000 | ORAL_TABLET | Freq: Once | ORAL | Status: DC

## 2021-07-12 NOTE — Discharge Instructions (Signed)
1.  At this time you are being treated for pneumonia.  Start taking Augmentin and Zithromax tomorrow as prescribed. ?2.  Call your oncologist to update them regarding your chest pain and findings in the emergency department today. ?

## 2021-07-12 NOTE — ED Provider Notes (Signed)
?[facility] EMERG[facility] DEPT ?Provider Note ? ? ?CSN: [id number] ?Arrival date & time: 07/12/21  1349 ? ?  ? ?History ? ?Chief Complaint  ?Patient presents with  ? Chest Pain  ? ? ?[name] is a 61 y.o. female. ? 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  Patient presented to the emergency department [phone] with obstruction and intussusception.  Patient required partial bowel resection and pathology identified melanoma in the bowel wall.  Patient has been recovering well from her partial bowel resection.  She reports however she developed some fairly severe right-sided chest pain within the past 2 days it has escalated and now any movements or deep breaths are severely painful.  Patient reports he has not had any fevers or chills.  No vomiting.  She has felt somewhat nauseated and taking Zofran once daily.  Her pain has been controlled by tramadol postoperatively but then when the chest pain started that got severe and she is requiring repeat dosing of tramadol for controlling the chest pain.  Patient was seen at [facility] approximately a week ago and had a PET scan that showed 2 cm nodule in the right lung.  Patient has not had any swelling or pain of the lower extremities. ?  ? ?Home Medications ?Prior to Admission medications   ?Medication Sig Start Date End Date Taking? Authorizing Provider  ?amoxicillin-clavulanate (AUGMENTIN) 875-125 MG tablet Take 1 tablet by mouth 2 (two) times daily. One po bid x 7 days 07/12/21  Yes Ranson Belluomini, Jeannie Done, MD  ?azithromycin (ZITHROMAX) 250 MG tablet Take 1 tablet (250 mg total) by mouth daily. Take first 2 tablets together, then 1 every day until finished. 07/12/21  Yes Charlesetta Shanks, MD  ?oxyCODONE-acetaminophen (PERCOCET) 5-325 MG tablet Take 1-2 tablets by mouth every 6 (six) hours as needed. 07/12/21  Yes Charlesetta Shanks, MD  ?traMADol Veatrice Bourbon) 50 MG tablet 1-2 tablets every 6 hours as needed for pain 07/12/21  Yes  Pariss Hommes, Jeannie Done, MD  ?acetaminophen (TYLENOL) 500 MG tablet Take 2 tablets (1,000 mg total) by mouth every 8 (eight) hours as needed for mild pain or moderate pain. 06/20/21   Winferd Humphrey, PA-C  ?ibuprofen (ADVIL) 600 MG tablet Take 1 tablet (600 mg total) by mouth every 6 (six) hours as needed for headache, mild pain or moderate pain. 06/20/21   Winferd Humphrey, PA-C  ?MAGNESIUM CITRATE PO Take 1 tablet by mouth every evening.    [provider]  ?ondansetron (ZOFRAN) 4 MG tablet Take 1 tablet (4 mg total) by mouth every 8 (eight) hours as needed for nausea or vomiting. 06/30/21 3/79/02  Leighton Ruff, MD  ?   ? ?Allergies    ?Patient has no known allergies.   ? ?Review of Systems   ?Review of Systems ?10 systems reviewed and negative except as per HPI ?Physical Exam ?Updated Vital Signs ?BP 111/68   Pulse (!) 57   Temp 98.4 ?F (36.9 ?C)   Resp 17   Ht '5\' 5"'$  (1.651 m)   Wt 45.4 kg   SpO2 96%   BMI 16.64 kg/m?  ?Physical Exam ?Constitutional:   ?   Comments: Alert and nontoxic.  Clear mental status.  No respiratory distress.  ?HENT:  ?   Mouth/Throat:  ?   Pharynx: Oropharynx is clear.  ?Eyes:  ?   Extraocular Movements: Extraocular movements intact.  ?Cardiovascular:  ?   Rate and Rhythm: Normal rate and regular rhythm.  ?Pulmonary:  ?  Comments: No respiratory distress at rest.  There are some crackles present on the right midlung field. ?Abdominal:  ?   Comments: Surgical abdominal scar is healing very well.  Patient does not endorse tenderness to moderate palpation of the abdomen no abdominal distention  ?Musculoskeletal:     ?   General: No swelling or tenderness. Normal range of motion.  ?   Right lower leg: No edema.  ?   Left lower leg: No edema.  ?Skin: ?   General: Skin is warm and dry.  ?Neurological:  ?   General: No focal deficit present.  ?   Mental Status: She is oriented to person, place, and time.  ?   Motor: No weakness.  ?   Coordination: Coordination normal.  ?   Comments:  Mental status is clear.  Speech is normal.  Patient follows commands without difficulty.  ?Psychiatric:     ?   Mood and Affect: Mood normal.  ? ? ?ED Results / Procedures / Treatments   ?Labs ?(all labs ordered are listed, but only abnormal results are displayed) ?Labs Reviewed  ?BASIC METABOLIC PANEL - Abnormal; Notable for the following components:  ?    Result Value  ? Chloride 96 (*)   ? All other components within normal limits  ?CBC - Abnormal; Notable for the following components:  ? WBC 12.3 (*)   ? Hemoglobin 11.7 (*)   ? All other components within normal limits  ?HEPATIC FUNCTION PANEL - Abnormal; Notable for the following components:  ? AST 12 (*)   ? All other components within normal limits  ?LIPASE, BLOOD  ?TROPONIN I (HIGH SENSITIVITY)  ?TROPONIN I (HIGH SENSITIVITY)  ? ? ?EKG ?EKG Interpretation ? ?Date/Time:  Saturday Jul 12 2021 13:59:24 EDT ?Ventricular Rate:  64 ?PR Interval:  164 ?QRS Duration: 84 ?QT Interval:  442 ?QTC Calculation: 455 ?R Axis:   58 ?Text Interpretation: Normal sinus rhythm Normal ECG No previous ECGs available t waves diffusely flat. no ischemic changes, no old comparison. Confirmed by Charlesetta Shanks [phone]) on 07/12/2021 3:20:17 PM ? ?Radiology ?DG Chest 2 View ? ?Result Date: 07/12/2021 ?CLINICAL DATA:  Right chest and upper back pain today. Intestinal surgery last month. EXAM: CHEST - 2 VIEW COMPARISON:  Chest radiograph, 06/16/2021 and abdomen and pelvis CT from 06/16/2021. FINDINGS: Cardiac silhouette top-normal in size. No mediastinal or hilar masses. No evidence of adenopathy. Focal opacity noted at the base of the right middle lobe, consistent with atelectasis or pneumonia. This was present but incompletely imaged on the previous CT scan. Lungs hyperexpanded, otherwise clear. Trace pleural effusions, new since the prior studies. No pneumothorax. Skeletal structures are intact. There are surgical vascular clips along the right chest wall and inferior axilla  bilaterally consistent previous breast surgery, stable. IMPRESSION: 1. Focal opacity at the base of the right middle lobe consistent with atelectasis or pneumonia. Neoplastic disease is less likely but not excluded. Recommend follow-up chest radiographs in 6-8 weeks to assess for improvement/resolution. No other evidence of acute cardiopulmonary disease. 2. Trace bilateral pleural effusions, new since the prior exams. Electronically Signed   By: Lajean Manes M.D.   On: 07/12/2021 16:24  ? ?CT Angio Chest PE W/Cm &/Or Wo Cm ? ?Result Date: 07/12/2021 ?CLINICAL DATA:  PE suspected, right-sided chest pain EXAM: CT ANGIOGRAPHY CHEST WITH CONTRAST TECHNIQUE: Multidetector CT imaging of the chest was performed using the standard protocol during bolus administration of intravenous contrast. Multiplanar CT image reconstructions and MIPs were obtained  to evaluate the vascular anatomy. RADIATION DOSE REDUCTION: This exam was performed according to the departmental dose-optimization program which includes automated exposure control, adjustment of the mA and/or kV according to patient size and/or use of iterative reconstruction technique. CONTRAST:  12m OMNIPAQUE IOHEXOL 350 MG/ML SOLN COMPARISON:  None Available. FINDINGS: Cardiovascular: Satisfactory opacification of the pulmonary arteries to the segmental level. No evidence of pulmonary embolism. Cardiomegaly. Small pericardial effusion. Mediastinum/Nodes: No enlarged mediastinal, hilar, or axillary lymph nodes. Thyroid gland, trachea, and esophagus demonstrate no significant findings. Lungs/Pleura: Small right, trace left pleural effusions and associated atelectasis or consolidation. Dense consolidation of the lateral segment right middle lobe (series 7, image 101). Upper Abdomen: No acute abnormality. Musculoskeletal: No chest wall abnormality. No acute osseous findings. Review of the MIP images confirms the above findings. IMPRESSION: 1. Negative examination for  pulmonary embolism. 2. Dense consolidation of the lateral segment right middle lobe. Given the focal, subpleural and peripheral nature of this consolidation, morphology is conceivably consistent with a small pulmonary infarction

## 2021-07-12 NOTE — ED Triage Notes (Signed)
Pt c/o right sided chest pain x 3 days. Pt had small intestine surgery April 18th.  ?

## 2021-07-14 DIAGNOSIS — Z85828 Personal history of other malignant neoplasm of skin: Secondary | ICD-10-CM | POA: Diagnosis not present

## 2021-07-14 DIAGNOSIS — Z85820 Personal history of malignant melanoma of skin: Secondary | ICD-10-CM | POA: Diagnosis not present

## 2021-07-14 DIAGNOSIS — L821 Other seborrheic keratosis: Secondary | ICD-10-CM | POA: Diagnosis not present

## 2021-07-14 DIAGNOSIS — L814 Other melanin hyperpigmentation: Secondary | ICD-10-CM | POA: Diagnosis not present

## 2021-07-30 DIAGNOSIS — Z5112 Encounter for antineoplastic immunotherapy: Secondary | ICD-10-CM | POA: Diagnosis not present

## 2021-07-30 DIAGNOSIS — C4359 Malignant melanoma of other part of trunk: Secondary | ICD-10-CM | POA: Diagnosis not present

## 2021-07-30 DIAGNOSIS — C799 Secondary malignant neoplasm of unspecified site: Secondary | ICD-10-CM | POA: Diagnosis not present

## 2021-07-30 DIAGNOSIS — Z79899 Other long term (current) drug therapy: Secondary | ICD-10-CM | POA: Diagnosis not present

## 2021-08-19 DIAGNOSIS — H31092 Other chorioretinal scars, left eye: Secondary | ICD-10-CM | POA: Diagnosis not present

## 2021-08-19 DIAGNOSIS — H2513 Age-related nuclear cataract, bilateral: Secondary | ICD-10-CM | POA: Diagnosis not present

## 2021-08-19 DIAGNOSIS — H43812 Vitreous degeneration, left eye: Secondary | ICD-10-CM | POA: Diagnosis not present

## 2021-08-20 DIAGNOSIS — C4359 Malignant melanoma of other part of trunk: Secondary | ICD-10-CM | POA: Diagnosis not present

## 2021-08-20 DIAGNOSIS — R5383 Other fatigue: Secondary | ICD-10-CM | POA: Diagnosis not present

## 2021-08-20 DIAGNOSIS — C439 Malignant melanoma of skin, unspecified: Secondary | ICD-10-CM | POA: Diagnosis not present

## 2021-08-20 DIAGNOSIS — Z79899 Other long term (current) drug therapy: Secondary | ICD-10-CM | POA: Diagnosis not present

## 2021-08-20 DIAGNOSIS — T451X5A Adverse effect of antineoplastic and immunosuppressive drugs, initial encounter: Secondary | ICD-10-CM | POA: Diagnosis not present

## 2021-08-20 DIAGNOSIS — L299 Pruritus, unspecified: Secondary | ICD-10-CM | POA: Diagnosis not present

## 2021-08-20 DIAGNOSIS — R110 Nausea: Secondary | ICD-10-CM | POA: Diagnosis not present

## 2021-08-20 DIAGNOSIS — L271 Localized skin eruption due to drugs and medicaments taken internally: Secondary | ICD-10-CM | POA: Diagnosis not present

## 2021-08-20 DIAGNOSIS — R519 Headache, unspecified: Secondary | ICD-10-CM | POA: Diagnosis not present

## 2021-08-20 DIAGNOSIS — R7401 Elevation of levels of liver transaminase levels: Secondary | ICD-10-CM | POA: Diagnosis not present

## 2021-08-20 DIAGNOSIS — R946 Abnormal results of thyroid function studies: Secondary | ICD-10-CM | POA: Diagnosis not present

## 2021-08-20 DIAGNOSIS — Z5112 Encounter for antineoplastic immunotherapy: Secondary | ICD-10-CM | POA: Diagnosis not present

## 2021-08-20 DIAGNOSIS — C784 Secondary malignant neoplasm of small intestine: Secondary | ICD-10-CM | POA: Diagnosis not present

## 2021-08-26 DIAGNOSIS — R7401 Elevation of levels of liver transaminase levels: Secondary | ICD-10-CM | POA: Diagnosis not present

## 2021-08-26 DIAGNOSIS — C439 Malignant melanoma of skin, unspecified: Secondary | ICD-10-CM | POA: Diagnosis not present

## 2021-08-26 DIAGNOSIS — E0590 Thyrotoxicosis, unspecified without thyrotoxic crisis or storm: Secondary | ICD-10-CM | POA: Diagnosis not present

## 2021-09-10 DIAGNOSIS — L270 Generalized skin eruption due to drugs and medicaments taken internally: Secondary | ICD-10-CM | POA: Diagnosis not present

## 2021-09-10 DIAGNOSIS — C439 Malignant melanoma of skin, unspecified: Secondary | ICD-10-CM | POA: Diagnosis not present

## 2021-09-10 DIAGNOSIS — L299 Pruritus, unspecified: Secondary | ICD-10-CM | POA: Diagnosis not present

## 2021-09-10 DIAGNOSIS — C4359 Malignant melanoma of other part of trunk: Secondary | ICD-10-CM | POA: Diagnosis not present

## 2021-09-10 DIAGNOSIS — E032 Hypothyroidism due to medicaments and other exogenous substances: Secondary | ICD-10-CM | POA: Diagnosis not present

## 2021-09-22 DIAGNOSIS — C439 Malignant melanoma of skin, unspecified: Secondary | ICD-10-CM | POA: Diagnosis not present

## 2021-09-22 DIAGNOSIS — C4359 Malignant melanoma of other part of trunk: Secondary | ICD-10-CM | POA: Diagnosis not present

## 2021-09-26 DIAGNOSIS — C439 Malignant melanoma of skin, unspecified: Secondary | ICD-10-CM | POA: Diagnosis not present

## 2021-09-26 DIAGNOSIS — E032 Hypothyroidism due to medicaments and other exogenous substances: Secondary | ICD-10-CM | POA: Diagnosis not present

## 2021-09-26 DIAGNOSIS — L270 Generalized skin eruption due to drugs and medicaments taken internally: Secondary | ICD-10-CM | POA: Diagnosis not present

## 2021-10-08 DIAGNOSIS — R7989 Other specified abnormal findings of blood chemistry: Secondary | ICD-10-CM | POA: Diagnosis not present

## 2021-10-08 DIAGNOSIS — E032 Hypothyroidism due to medicaments and other exogenous substances: Secondary | ICD-10-CM | POA: Diagnosis not present

## 2021-10-08 DIAGNOSIS — C7800 Secondary malignant neoplasm of unspecified lung: Secondary | ICD-10-CM | POA: Diagnosis not present

## 2021-10-08 DIAGNOSIS — Z5112 Encounter for antineoplastic immunotherapy: Secondary | ICD-10-CM | POA: Diagnosis not present

## 2021-10-08 DIAGNOSIS — Z79899 Other long term (current) drug therapy: Secondary | ICD-10-CM | POA: Diagnosis not present

## 2021-10-08 DIAGNOSIS — R21 Rash and other nonspecific skin eruption: Secondary | ICD-10-CM | POA: Diagnosis not present

## 2021-10-08 DIAGNOSIS — T451X5A Adverse effect of antineoplastic and immunosuppressive drugs, initial encounter: Secondary | ICD-10-CM | POA: Diagnosis not present

## 2021-10-08 DIAGNOSIS — L299 Pruritus, unspecified: Secondary | ICD-10-CM | POA: Diagnosis not present

## 2021-10-08 DIAGNOSIS — Z79890 Hormone replacement therapy (postmenopausal): Secondary | ICD-10-CM | POA: Diagnosis not present

## 2021-10-08 DIAGNOSIS — C439 Malignant melanoma of skin, unspecified: Secondary | ICD-10-CM | POA: Diagnosis not present

## 2021-10-08 DIAGNOSIS — C4359 Malignant melanoma of other part of trunk: Secondary | ICD-10-CM | POA: Diagnosis not present

## 2021-10-14 DIAGNOSIS — C4359 Malignant melanoma of other part of trunk: Secondary | ICD-10-CM | POA: Diagnosis not present

## 2021-10-17 DIAGNOSIS — C4359 Malignant melanoma of other part of trunk: Secondary | ICD-10-CM | POA: Diagnosis not present

## 2021-10-20 ENCOUNTER — Encounter (HOSPITAL_COMMUNITY): Payer: Self-pay

## 2021-11-04 DIAGNOSIS — Z5112 Encounter for antineoplastic immunotherapy: Secondary | ICD-10-CM | POA: Diagnosis not present

## 2021-11-04 DIAGNOSIS — C439 Malignant melanoma of skin, unspecified: Secondary | ICD-10-CM | POA: Diagnosis not present

## 2021-11-04 DIAGNOSIS — Z8719 Personal history of other diseases of the digestive system: Secondary | ICD-10-CM | POA: Diagnosis not present

## 2021-11-04 DIAGNOSIS — C4359 Malignant melanoma of other part of trunk: Secondary | ICD-10-CM | POA: Diagnosis not present

## 2021-11-04 DIAGNOSIS — Z79899 Other long term (current) drug therapy: Secondary | ICD-10-CM | POA: Diagnosis not present

## 2021-11-04 DIAGNOSIS — R21 Rash and other nonspecific skin eruption: Secondary | ICD-10-CM | POA: Diagnosis not present

## 2021-11-04 DIAGNOSIS — R7989 Other specified abnormal findings of blood chemistry: Secondary | ICD-10-CM | POA: Diagnosis not present

## 2021-11-04 DIAGNOSIS — E876 Hypokalemia: Secondary | ICD-10-CM | POA: Diagnosis not present

## 2021-11-04 DIAGNOSIS — C7800 Secondary malignant neoplasm of unspecified lung: Secondary | ICD-10-CM | POA: Diagnosis not present

## 2021-11-04 DIAGNOSIS — E032 Hypothyroidism due to medicaments and other exogenous substances: Secondary | ICD-10-CM | POA: Diagnosis not present

## 2021-11-04 DIAGNOSIS — E039 Hypothyroidism, unspecified: Secondary | ICD-10-CM | POA: Diagnosis not present

## 2021-11-04 DIAGNOSIS — Z9049 Acquired absence of other specified parts of digestive tract: Secondary | ICD-10-CM | POA: Diagnosis not present

## 2021-11-04 DIAGNOSIS — R5383 Other fatigue: Secondary | ICD-10-CM | POA: Diagnosis not present

## 2021-11-26 DIAGNOSIS — E065 Other chronic thyroiditis: Secondary | ICD-10-CM | POA: Diagnosis not present

## 2021-11-26 DIAGNOSIS — C784 Secondary malignant neoplasm of small intestine: Secondary | ICD-10-CM | POA: Diagnosis not present

## 2021-11-26 DIAGNOSIS — C439 Malignant melanoma of skin, unspecified: Secondary | ICD-10-CM | POA: Diagnosis not present

## 2021-11-26 DIAGNOSIS — C4359 Malignant melanoma of other part of trunk: Secondary | ICD-10-CM | POA: Diagnosis not present

## 2021-11-27 DIAGNOSIS — C4359 Malignant melanoma of other part of trunk: Secondary | ICD-10-CM | POA: Diagnosis not present

## 2021-12-10 DIAGNOSIS — R5383 Other fatigue: Secondary | ICD-10-CM | POA: Diagnosis not present

## 2021-12-10 DIAGNOSIS — C4359 Malignant melanoma of other part of trunk: Secondary | ICD-10-CM | POA: Diagnosis not present

## 2021-12-10 DIAGNOSIS — E876 Hypokalemia: Secondary | ICD-10-CM | POA: Diagnosis not present

## 2021-12-10 DIAGNOSIS — E032 Hypothyroidism due to medicaments and other exogenous substances: Secondary | ICD-10-CM | POA: Diagnosis not present

## 2021-12-10 DIAGNOSIS — Z79899 Other long term (current) drug therapy: Secondary | ICD-10-CM | POA: Diagnosis not present

## 2021-12-10 DIAGNOSIS — Z5112 Encounter for antineoplastic immunotherapy: Secondary | ICD-10-CM | POA: Diagnosis not present

## 2021-12-10 DIAGNOSIS — T451X5D Adverse effect of antineoplastic and immunosuppressive drugs, subsequent encounter: Secondary | ICD-10-CM | POA: Diagnosis not present

## 2021-12-10 DIAGNOSIS — C784 Secondary malignant neoplasm of small intestine: Secondary | ICD-10-CM | POA: Diagnosis not present

## 2021-12-10 DIAGNOSIS — L271 Localized skin eruption due to drugs and medicaments taken internally: Secondary | ICD-10-CM | POA: Diagnosis not present

## 2021-12-10 DIAGNOSIS — C439 Malignant melanoma of skin, unspecified: Secondary | ICD-10-CM | POA: Diagnosis not present

## 2021-12-10 DIAGNOSIS — Z79890 Hormone replacement therapy (postmenopausal): Secondary | ICD-10-CM | POA: Diagnosis not present

## 2021-12-10 DIAGNOSIS — L658 Other specified nonscarring hair loss: Secondary | ICD-10-CM | POA: Diagnosis not present

## 2021-12-11 DIAGNOSIS — Z0000 Encounter for general adult medical examination without abnormal findings: Secondary | ICD-10-CM | POA: Diagnosis not present

## 2022-01-12 DIAGNOSIS — T50905A Adverse effect of unspecified drugs, medicaments and biological substances, initial encounter: Secondary | ICD-10-CM | POA: Diagnosis not present

## 2022-01-12 DIAGNOSIS — C784 Secondary malignant neoplasm of small intestine: Secondary | ICD-10-CM | POA: Diagnosis not present

## 2022-01-12 DIAGNOSIS — Z79899 Other long term (current) drug therapy: Secondary | ICD-10-CM | POA: Diagnosis not present

## 2022-01-12 DIAGNOSIS — C439 Malignant melanoma of skin, unspecified: Secondary | ICD-10-CM | POA: Diagnosis not present

## 2022-01-12 DIAGNOSIS — L270 Generalized skin eruption due to drugs and medicaments taken internally: Secondary | ICD-10-CM | POA: Diagnosis not present

## 2022-01-12 DIAGNOSIS — L298 Other pruritus: Secondary | ICD-10-CM | POA: Diagnosis not present

## 2022-01-12 DIAGNOSIS — Z5112 Encounter for antineoplastic immunotherapy: Secondary | ICD-10-CM | POA: Diagnosis not present

## 2022-01-12 DIAGNOSIS — E039 Hypothyroidism, unspecified: Secondary | ICD-10-CM | POA: Diagnosis not present

## 2022-01-12 DIAGNOSIS — E876 Hypokalemia: Secondary | ICD-10-CM | POA: Diagnosis not present

## 2022-01-12 DIAGNOSIS — C4359 Malignant melanoma of other part of trunk: Secondary | ICD-10-CM | POA: Diagnosis not present

## 2022-01-27 DIAGNOSIS — L299 Pruritus, unspecified: Secondary | ICD-10-CM | POA: Diagnosis not present

## 2022-02-03 DIAGNOSIS — L299 Pruritus, unspecified: Secondary | ICD-10-CM | POA: Diagnosis not present

## 2022-02-03 DIAGNOSIS — L270 Generalized skin eruption due to drugs and medicaments taken internally: Secondary | ICD-10-CM | POA: Diagnosis not present

## 2022-02-12 DIAGNOSIS — R234 Changes in skin texture: Secondary | ICD-10-CM | POA: Diagnosis not present

## 2022-02-12 DIAGNOSIS — Z79899 Other long term (current) drug therapy: Secondary | ICD-10-CM | POA: Diagnosis not present

## 2022-02-12 DIAGNOSIS — L308 Other specified dermatitis: Secondary | ICD-10-CM | POA: Diagnosis not present

## 2022-02-12 DIAGNOSIS — R109 Unspecified abdominal pain: Secondary | ICD-10-CM | POA: Diagnosis not present

## 2022-02-12 DIAGNOSIS — C4359 Malignant melanoma of other part of trunk: Secondary | ICD-10-CM | POA: Diagnosis not present

## 2022-02-12 DIAGNOSIS — T451X5A Adverse effect of antineoplastic and immunosuppressive drugs, initial encounter: Secondary | ICD-10-CM | POA: Diagnosis not present

## 2022-02-12 DIAGNOSIS — R911 Solitary pulmonary nodule: Secondary | ICD-10-CM | POA: Diagnosis not present

## 2022-02-12 DIAGNOSIS — L989 Disorder of the skin and subcutaneous tissue, unspecified: Secondary | ICD-10-CM | POA: Diagnosis not present

## 2022-02-12 DIAGNOSIS — L271 Localized skin eruption due to drugs and medicaments taken internally: Secondary | ICD-10-CM | POA: Diagnosis not present

## 2022-02-12 DIAGNOSIS — R7989 Other specified abnormal findings of blood chemistry: Secondary | ICD-10-CM | POA: Diagnosis not present

## 2022-02-12 DIAGNOSIS — R21 Rash and other nonspecific skin eruption: Secondary | ICD-10-CM | POA: Diagnosis not present

## 2022-02-12 DIAGNOSIS — C7989 Secondary malignant neoplasm of other specified sites: Secondary | ICD-10-CM | POA: Diagnosis not present

## 2022-02-12 DIAGNOSIS — R5383 Other fatigue: Secondary | ICD-10-CM | POA: Diagnosis not present

## 2022-02-12 DIAGNOSIS — Z5112 Encounter for antineoplastic immunotherapy: Secondary | ICD-10-CM | POA: Diagnosis not present

## 2022-02-12 DIAGNOSIS — E032 Hypothyroidism due to medicaments and other exogenous substances: Secondary | ICD-10-CM | POA: Diagnosis not present

## 2022-02-12 DIAGNOSIS — C439 Malignant melanoma of skin, unspecified: Secondary | ICD-10-CM | POA: Diagnosis not present

## 2022-03-05 DIAGNOSIS — D692 Other nonthrombocytopenic purpura: Secondary | ICD-10-CM | POA: Diagnosis not present

## 2022-03-05 DIAGNOSIS — L812 Freckles: Secondary | ICD-10-CM | POA: Diagnosis not present

## 2022-03-05 DIAGNOSIS — Z85828 Personal history of other malignant neoplasm of skin: Secondary | ICD-10-CM | POA: Diagnosis not present

## 2022-03-05 DIAGNOSIS — L820 Inflamed seborrheic keratosis: Secondary | ICD-10-CM | POA: Diagnosis not present

## 2022-03-05 DIAGNOSIS — Z85820 Personal history of malignant melanoma of skin: Secondary | ICD-10-CM | POA: Diagnosis not present

## 2022-03-09 DIAGNOSIS — Z5112 Encounter for antineoplastic immunotherapy: Secondary | ICD-10-CM | POA: Diagnosis not present

## 2022-03-09 DIAGNOSIS — C784 Secondary malignant neoplasm of small intestine: Secondary | ICD-10-CM | POA: Diagnosis not present

## 2022-03-09 DIAGNOSIS — Z79899 Other long term (current) drug therapy: Secondary | ICD-10-CM | POA: Diagnosis not present

## 2022-03-09 DIAGNOSIS — C4359 Malignant melanoma of other part of trunk: Secondary | ICD-10-CM | POA: Diagnosis not present

## 2022-03-09 DIAGNOSIS — C439 Malignant melanoma of skin, unspecified: Secondary | ICD-10-CM | POA: Diagnosis not present

## 2022-03-09 DIAGNOSIS — L659 Nonscarring hair loss, unspecified: Secondary | ICD-10-CM | POA: Diagnosis not present

## 2022-03-09 DIAGNOSIS — L298 Other pruritus: Secondary | ICD-10-CM | POA: Diagnosis not present

## 2022-03-31 DIAGNOSIS — Z9289 Personal history of other medical treatment: Secondary | ICD-10-CM | POA: Diagnosis not present

## 2022-03-31 DIAGNOSIS — L659 Nonscarring hair loss, unspecified: Secondary | ICD-10-CM | POA: Diagnosis not present

## 2022-03-31 DIAGNOSIS — E032 Hypothyroidism due to medicaments and other exogenous substances: Secondary | ICD-10-CM | POA: Diagnosis not present

## 2022-04-17 DIAGNOSIS — L298 Other pruritus: Secondary | ICD-10-CM | POA: Diagnosis not present

## 2022-04-17 DIAGNOSIS — T451X5D Adverse effect of antineoplastic and immunosuppressive drugs, subsequent encounter: Secondary | ICD-10-CM | POA: Diagnosis not present

## 2022-04-17 DIAGNOSIS — Z5112 Encounter for antineoplastic immunotherapy: Secondary | ICD-10-CM | POA: Diagnosis not present

## 2022-04-17 DIAGNOSIS — E032 Hypothyroidism due to medicaments and other exogenous substances: Secondary | ICD-10-CM | POA: Diagnosis not present

## 2022-04-17 DIAGNOSIS — C784 Secondary malignant neoplasm of small intestine: Secondary | ICD-10-CM | POA: Diagnosis not present

## 2022-04-17 DIAGNOSIS — C439 Malignant melanoma of skin, unspecified: Secondary | ICD-10-CM | POA: Diagnosis not present

## 2022-04-17 DIAGNOSIS — Z85820 Personal history of malignant melanoma of skin: Secondary | ICD-10-CM | POA: Diagnosis not present

## 2022-04-17 DIAGNOSIS — L659 Nonscarring hair loss, unspecified: Secondary | ICD-10-CM | POA: Diagnosis not present

## 2022-04-17 DIAGNOSIS — Z79899 Other long term (current) drug therapy: Secondary | ICD-10-CM | POA: Diagnosis not present

## 2022-04-17 DIAGNOSIS — C4359 Malignant melanoma of other part of trunk: Secondary | ICD-10-CM | POA: Diagnosis not present

## 2022-04-17 DIAGNOSIS — Z872 Personal history of diseases of the skin and subcutaneous tissue: Secondary | ICD-10-CM | POA: Diagnosis not present

## 2022-04-17 DIAGNOSIS — Z8639 Personal history of other endocrine, nutritional and metabolic disease: Secondary | ICD-10-CM | POA: Diagnosis not present

## 2022-04-17 DIAGNOSIS — C7801 Secondary malignant neoplasm of right lung: Secondary | ICD-10-CM | POA: Diagnosis not present

## 2022-05-20 DIAGNOSIS — Z79899 Other long term (current) drug therapy: Secondary | ICD-10-CM | POA: Diagnosis not present

## 2022-05-20 DIAGNOSIS — Z5112 Encounter for antineoplastic immunotherapy: Secondary | ICD-10-CM | POA: Diagnosis not present

## 2022-05-20 DIAGNOSIS — C4359 Malignant melanoma of other part of trunk: Secondary | ICD-10-CM | POA: Diagnosis not present

## 2022-05-20 DIAGNOSIS — C439 Malignant melanoma of skin, unspecified: Secondary | ICD-10-CM | POA: Diagnosis not present

## 2022-05-20 DIAGNOSIS — R911 Solitary pulmonary nodule: Secondary | ICD-10-CM | POA: Diagnosis not present

## 2022-05-20 DIAGNOSIS — L659 Nonscarring hair loss, unspecified: Secondary | ICD-10-CM | POA: Diagnosis not present

## 2022-05-20 DIAGNOSIS — E032 Hypothyroidism due to medicaments and other exogenous substances: Secondary | ICD-10-CM | POA: Diagnosis not present

## 2022-05-20 DIAGNOSIS — C784 Secondary malignant neoplasm of small intestine: Secondary | ICD-10-CM | POA: Diagnosis not present

## 2022-05-20 DIAGNOSIS — T451X5A Adverse effect of antineoplastic and immunosuppressive drugs, initial encounter: Secondary | ICD-10-CM | POA: Diagnosis not present

## 2022-05-20 DIAGNOSIS — R7989 Other specified abnormal findings of blood chemistry: Secondary | ICD-10-CM | POA: Diagnosis not present

## 2022-05-21 ENCOUNTER — Other Ambulatory Visit: Payer: Self-pay

## 2022-05-21 ENCOUNTER — Emergency Department (HOSPITAL_COMMUNITY): Payer: BC Managed Care – PPO

## 2022-05-21 ENCOUNTER — Emergency Department (HOSPITAL_COMMUNITY)
Admission: EM | Admit: 2022-05-21 | Discharge: 2022-05-21 | Disposition: A | Discharge status: Home / Self Care | Payer: BC Managed Care – PPO | Attending: Emergency Medicine | Admitting: Emergency Medicine

## 2022-05-21 ENCOUNTER — Encounter (HOSPITAL_COMMUNITY): Payer: Self-pay | Admitting: *Deleted

## 2022-05-21 DIAGNOSIS — R112 Nausea with vomiting, unspecified: Secondary | ICD-10-CM | POA: Diagnosis not present

## 2022-05-21 DIAGNOSIS — R1084 Generalized abdominal pain: Secondary | ICD-10-CM | POA: Insufficient documentation

## 2022-05-21 DIAGNOSIS — K8020 Calculus of gallbladder without cholecystitis without obstruction: Secondary | ICD-10-CM | POA: Diagnosis not present

## 2022-05-21 DIAGNOSIS — R1032 Left lower quadrant pain: Secondary | ICD-10-CM | POA: Diagnosis not present

## 2022-05-21 LAB — I-STAT CHEM 8, ED
BUN: 12 mg/dL (ref 8–23)
Calcium, Ion: 1.14 mmol/L — ABNORMAL LOW (ref 1.15–1.40)
Chloride: 106 mmol/L (ref 98–111)
Creatinine, Ser: 0.8 mg/dL (ref 0.44–1.00)
Glucose, Bld: 133 mg/dL — ABNORMAL HIGH (ref 70–99)
HCT: 43 % (ref 36.0–46.0)
Hemoglobin: 14.6 g/dL (ref 12.0–15.0)
Potassium: 4 mmol/L (ref 3.5–5.1)
Sodium: 140 mmol/L (ref 135–145)
TCO2: 23 mmol/L (ref 22–32)

## 2022-05-21 LAB — COMPREHENSIVE METABOLIC PANEL
ALT: 21 U/L (ref 0–44)
AST: 27 U/L (ref 15–41)
Albumin: 4 g/dL (ref 3.5–5.0)
Alkaline Phosphatase: 84 U/L (ref 38–126)
Anion gap: 10 (ref 5–15)
BUN: 8 mg/dL (ref 8–23)
CO2: 23 mmol/L (ref 22–32)
Calcium: 9.4 mg/dL (ref 8.9–10.3)
Chloride: 104 mmol/L (ref 98–111)
Creatinine, Ser: 0.88 mg/dL (ref 0.44–1.00)
GFR, Estimated: >60 mL/min (ref >60)
Glucose, Bld: 140 mg/dL — ABNORMAL HIGH (ref 70–99)
Potassium: 3.8 mmol/L (ref 3.5–5.1)
Sodium: 137 mmol/L (ref 135–145)
Total Bilirubin: 0.9 mg/dL (ref 0.3–1.2)
Total Protein: 7 g/dL (ref 6.5–8.1)

## 2022-05-21 LAB — CBC
HCT: 44.3 % (ref 36.0–46.0)
Hemoglobin: 14.4 g/dL (ref 12.0–15.0)
MCH: 28.1 pg (ref 26.0–34.0)
MCHC: 32.5 g/dL (ref 30.0–36.0)
MCV: 86.4 fL (ref 80.0–100.0)
Platelets: 279 10*3/uL (ref 150–400)
RBC: 5.13 MIL/uL — ABNORMAL HIGH (ref 3.87–5.11)
RDW: 16.6 % — ABNORMAL HIGH (ref 11.5–15.5)
WBC: 7.4 10*3/uL (ref 4.0–10.5)
nRBC: 0 % (ref 0.0–0.2)

## 2022-05-21 LAB — LACTIC ACID, PLASMA: Lactic Acid, Venous: 1.1 mmol/L (ref 0.5–1.9)

## 2022-05-21 LAB — LIPASE, BLOOD: Lipase: 29 U/L (ref 11–51)

## 2022-05-21 MED ORDER — DICYCLOMINE HCL 20 MG PO TABS: 20.0000 mg | ORAL_TABLET | Freq: Three times a day (TID) | ORAL | 0 refills | Status: AC | PRN

## 2022-05-21 MED ORDER — IOHEXOL 350 MG/ML SOLN
75.0000 mL | Freq: Once | INTRAVENOUS | Status: AC | PRN
  Administered 2022-05-21: 75 mL via INTRAVENOUS

## 2022-05-21 MED ORDER — ONDANSETRON HCL 4 MG/2ML IJ SOLN
4.0000 mg | Freq: Once | INTRAMUSCULAR | Status: AC
  Administered 2022-05-21: 4 mg via INTRAVENOUS
  Filled 2022-05-21: qty 2

## 2022-05-21 MED ORDER — POLYETHYLENE GLYCOL 3350 17 G PO PACK: 17.0000 g | PACK | Freq: Every day | ORAL | 0 refills | Status: AC

## 2022-05-21 MED ORDER — FENTANYL CITRATE PF 50 MCG/ML IJ SOSY
50.0000 ug | PREFILLED_SYRINGE | Freq: Once | INTRAMUSCULAR | Status: AC
  Administered 2022-05-21: 50 ug via INTRAVENOUS
  Filled 2022-05-21: qty 1

## 2022-05-21 MED ORDER — ONDANSETRON 4 MG PO TBDP: 4.0000 mg | ORAL_TABLET | Freq: Three times a day (TID) | ORAL | 0 refills | Status: AC | PRN

## 2022-05-21 MED ORDER — SODIUM CHLORIDE 0.9 % IV BOLUS
500.0000 mL | Freq: Once | INTRAVENOUS | Status: AC
  Administered 2022-05-21: 500 mL via INTRAVENOUS

## 2022-05-21 NOTE — ED Provider Notes (Signed)
Blood pressure 116/60, pulse (!) 45, temperature 98.9 F (37.2 C), temperature source Axillary, resp. rate (!) 24, height 5\' 5"  (1.651 m), weight 52.2 kg, SpO2 100 %.  Assuming care from Dr. Ralene Bathe.  In short, [name] is a 62 y.o. female with a chief complaint of Abdominal Pain, Nausea, and Emesis .  Refer to the original H&P for additional details.  The current plan of care is to follow up on CT abdomen/pelvis.  08:57 AM  Independently interpreted the CT images and agree with radiology.  Updated patient is feeling better.  Plan for Bentyl, Zofran, MiraLAX as needed and she will coordinate with her oncology team as well as her primary care physician. Discussed strict ED return precautions.    Margette Fast, MD 05/21/22 [phone]

## 2022-05-21 NOTE — Discharge Instructions (Signed)

## 2022-05-21 NOTE — ED Provider Notes (Signed)
[facility] Provider Note   CSN: [id number] Arrival date & time: 05/21/22  0449     History  Chief Complaint  Patient presents with   Abdominal Pain   Nausea   Emesis    [name] is a 62 y.o. female.  The history is provided by the patient, medical records and the spouse.  Abdominal Pain Associated symptoms: vomiting   Emesis Associated symptoms: abdominal pain   [name] is a 62 y.o. female who presents to the Emergency Department complaining of abdominal pain.  She has a history of metastatic melanoma currently undergoing immunotherapy with OPDIVI at [facility], last treatment was yesterday.  History is provided by both the patient and her husband.  Husband reports that yesterday afternoon she developed severe abdominal pain, which worsened overnight.  She did try tramadol at home with persistent pain and after taking the second tramadol she developed nausea and vomiting.  No associated fever, diarrhea, dysuria.  She did have prior pain in the past related to bowel obstruction.  Her PET scan yesterday had a preliminary report of decreased tumor burden in her lung per husband.     Home Medications Prior to Admission medications   Medication Sig Start Date End Date Taking? Authorizing Provider  acetaminophen (TYLENOL) 500 MG tablet Take 2 tablets (1,000 mg total) by mouth every 8 (eight) hours as needed for mild pain or moderate pain. 06/20/21   Winferd Humphrey, PA-C  amoxicillin-clavulanate (AUGMENTIN) 875-125 MG tablet Take 1 tablet by mouth 2 (two) times daily. One po bid x 7 days 07/12/21   Charlesetta Shanks, MD  azithromycin (ZITHROMAX) 250 MG tablet Take 1 tablet (250 mg total) by mouth daily. Take first 2 tablets together, then 1 every day until finished. 07/12/21   Charlesetta Shanks, MD  ibuprofen (ADVIL) 600 MG tablet Take 1 tablet (600 mg total) by mouth every 6 (six) hours as needed for headache, mild pain or moderate  pain. 06/20/21   Winferd Humphrey, PA-C  MAGNESIUM CITRATE PO Take 1 tablet by mouth every evening.    [provider]  ondansetron (ZOFRAN) 4 MG tablet Take 1 tablet (4 mg total) by mouth every 8 (eight) hours as needed for nausea or vomiting. 06/30/21 [id number]  Leighton Ruff, MD  oxyCODONE-acetaminophen (PERCOCET) 5-325 MG tablet Take 1-2 tablets by mouth every 6 (six) hours as needed. 07/12/21   Charlesetta Shanks, MD  traMADol Veatrice Bourbon) 50 MG tablet 1-2 tablets every 6 hours as needed for pain 07/12/21   Charlesetta Shanks, MD      Allergies    Dilaudid [hydromorphone hcl]    Review of Systems   Review of Systems  Gastrointestinal:  Positive for abdominal pain and vomiting.  All other systems reviewed and are negative.   Physical Exam Updated Vital Signs BP 116/60   Pulse (!) 45   Temp 98.9 F (37.2 C) (Axillary)   Resp (!) 24   Ht 5\' 5"  (1.651 m)   Wt 52.2 kg   SpO2 100%   BMI 19.14 kg/m  Physical Exam Vitals and nursing note reviewed.  Constitutional:      Appearance: She is well-developed.  HENT:     Head: Normocephalic and atraumatic.  Cardiovascular:     Rate and Rhythm: Normal rate and regular rhythm.  Pulmonary:     Effort: Pulmonary effort is normal. No respiratory distress.  Abdominal:     Palpations: Abdomen is soft.  Tenderness: There is abdominal tenderness. There is no guarding or rebound.     Comments: Moderate generalized abdominal tenderness, greatest over the left lower quadrant.  Musculoskeletal:        General: No tenderness.  Skin:    General: Skin is warm and dry.  Neurological:     Mental Status: She is alert and oriented to person, place, and time.  Psychiatric:        Behavior: Behavior normal.     ED Results / Procedures / Treatments   Labs (all labs ordered are listed, but only abnormal results are displayed) Labs Reviewed  COMPREHENSIVE METABOLIC PANEL - Abnormal; Notable for the following components:      Result Value    Glucose, Bld 140 (*)    All other components within normal limits  CBC - Abnormal; Notable for the following components:   RBC 5.13 (*)    RDW 16.6 (*)    All other components within normal limits  I-STAT CHEM 8, ED - Abnormal; Notable for the following components:   Glucose, Bld 133 (*)    Calcium, Ion 1.14 (*)    All other components within normal limits  LIPASE, BLOOD  URINALYSIS, ROUTINE W REFLEX MICROSCOPIC    EKG None  Radiology No results found.  Procedures Procedures    Medications Ordered in ED Medications - No data to display  ED Course/ Medical Decision Making/ A&P                             Medical Decision Making Amount and/or Complexity of Data Reviewed Labs: ordered. Radiology: ordered.  Risk Prescription drug management.   Patient with history of metastatic melanoma on immunotherapy here for evaluation of acute abdominal pain that started last night.  She does have significant tenderness on examination.  Labs are reassuring and near her baseline.  Plan to obtain a lactic acid as well as CT abdomen pelvis to further evaluate.  Will treat her pain with fentanyl and provide a fluid bolus.  Patient care transferred pending imaging and repeat evaluation.       Final Clinical Impression(s) / ED Diagnoses Final diagnoses:  None    Rx / DC Orders ED Discharge Orders     None         Quintella Reichert, MD 05/21/22 0800

## 2022-05-21 NOTE — ED Triage Notes (Signed)
Patient has hx of melanoma and surgery last year to remove a tumor.  Patient has been receiving immuno therapy at [facility] since.  She was seen yesterday and had a PET scan that showed no new tumors except the ones in her lungs.  Patient reports she began having worse abd pain on yesterday at 1600, nausea and vomitting at 1900.  She states it has been more dry heaves.  Patient is bent over in her chair with pain.  She is tearful.  Husband reports she took tramadol x 2 in an attempt to decrease her pain but she vomitted the 2nd dose.

## 2022-06-19 DIAGNOSIS — E876 Hypokalemia: Secondary | ICD-10-CM | POA: Diagnosis not present

## 2022-06-19 DIAGNOSIS — R7989 Other specified abnormal findings of blood chemistry: Secondary | ICD-10-CM | POA: Diagnosis not present

## 2022-06-19 DIAGNOSIS — Z79899 Other long term (current) drug therapy: Secondary | ICD-10-CM | POA: Diagnosis not present

## 2022-06-19 DIAGNOSIS — C4359 Malignant melanoma of other part of trunk: Secondary | ICD-10-CM | POA: Diagnosis not present

## 2022-06-19 DIAGNOSIS — Z5112 Encounter for antineoplastic immunotherapy: Secondary | ICD-10-CM | POA: Diagnosis not present

## 2022-06-19 DIAGNOSIS — C784 Secondary malignant neoplasm of small intestine: Secondary | ICD-10-CM | POA: Diagnosis not present

## 2022-06-19 DIAGNOSIS — C439 Malignant melanoma of skin, unspecified: Secondary | ICD-10-CM | POA: Diagnosis not present

## 2022-06-19 DIAGNOSIS — L270 Generalized skin eruption due to drugs and medicaments taken internally: Secondary | ICD-10-CM | POA: Diagnosis not present

## 2022-06-19 DIAGNOSIS — E032 Hypothyroidism due to medicaments and other exogenous substances: Secondary | ICD-10-CM | POA: Diagnosis not present

## 2022-06-19 DIAGNOSIS — C44509 Unspecified malignant neoplasm of skin of other part of trunk: Secondary | ICD-10-CM | POA: Diagnosis not present

## 2022-06-19 DIAGNOSIS — T451X5A Adverse effect of antineoplastic and immunosuppressive drugs, initial encounter: Secondary | ICD-10-CM | POA: Diagnosis not present

## 2022-07-22 DIAGNOSIS — T451X5A Adverse effect of antineoplastic and immunosuppressive drugs, initial encounter: Secondary | ICD-10-CM | POA: Diagnosis not present

## 2022-07-22 DIAGNOSIS — C784 Secondary malignant neoplasm of small intestine: Secondary | ICD-10-CM | POA: Diagnosis not present

## 2022-07-22 DIAGNOSIS — L659 Nonscarring hair loss, unspecified: Secondary | ICD-10-CM | POA: Diagnosis not present

## 2022-07-22 DIAGNOSIS — E876 Hypokalemia: Secondary | ICD-10-CM | POA: Diagnosis not present

## 2022-07-22 DIAGNOSIS — L270 Generalized skin eruption due to drugs and medicaments taken internally: Secondary | ICD-10-CM | POA: Diagnosis not present

## 2022-07-22 DIAGNOSIS — E032 Hypothyroidism due to medicaments and other exogenous substances: Secondary | ICD-10-CM | POA: Diagnosis not present

## 2022-07-22 DIAGNOSIS — Z85820 Personal history of malignant melanoma of skin: Secondary | ICD-10-CM | POA: Diagnosis not present

## 2022-07-22 DIAGNOSIS — Z79899 Other long term (current) drug therapy: Secondary | ICD-10-CM | POA: Diagnosis not present

## 2022-07-22 DIAGNOSIS — Z79890 Hormone replacement therapy (postmenopausal): Secondary | ICD-10-CM | POA: Diagnosis not present

## 2022-07-22 DIAGNOSIS — C7801 Secondary malignant neoplasm of right lung: Secondary | ICD-10-CM | POA: Diagnosis not present

## 2022-07-22 DIAGNOSIS — Z5112 Encounter for antineoplastic immunotherapy: Secondary | ICD-10-CM | POA: Diagnosis not present

## 2022-07-22 DIAGNOSIS — C4359 Malignant melanoma of other part of trunk: Secondary | ICD-10-CM | POA: Diagnosis not present

## 2022-07-23 DIAGNOSIS — R109 Unspecified abdominal pain: Secondary | ICD-10-CM | POA: Diagnosis not present

## 2022-07-23 DIAGNOSIS — Z9289 Personal history of other medical treatment: Secondary | ICD-10-CM | POA: Diagnosis not present

## 2022-07-23 DIAGNOSIS — E032 Hypothyroidism due to medicaments and other exogenous substances: Secondary | ICD-10-CM | POA: Diagnosis not present

## 2022-08-18 DIAGNOSIS — C4359 Malignant melanoma of other part of trunk: Secondary | ICD-10-CM | POA: Diagnosis not present

## 2022-08-18 DIAGNOSIS — C7801 Secondary malignant neoplasm of right lung: Secondary | ICD-10-CM | POA: Diagnosis not present

## 2022-08-18 DIAGNOSIS — L659 Nonscarring hair loss, unspecified: Secondary | ICD-10-CM | POA: Diagnosis not present

## 2022-08-18 DIAGNOSIS — C439 Malignant melanoma of skin, unspecified: Secondary | ICD-10-CM | POA: Diagnosis not present

## 2022-08-19 DIAGNOSIS — C439 Malignant melanoma of skin, unspecified: Secondary | ICD-10-CM | POA: Diagnosis not present

## 2022-09-07 DIAGNOSIS — Z85820 Personal history of malignant melanoma of skin: Secondary | ICD-10-CM | POA: Diagnosis not present

## 2022-09-07 DIAGNOSIS — Z85828 Personal history of other malignant neoplasm of skin: Secondary | ICD-10-CM | POA: Diagnosis not present

## 2022-09-07 DIAGNOSIS — D1801 Hemangioma of skin and subcutaneous tissue: Secondary | ICD-10-CM | POA: Diagnosis not present

## 2022-09-07 DIAGNOSIS — L812 Freckles: Secondary | ICD-10-CM | POA: Diagnosis not present

## 2022-09-18 DIAGNOSIS — C439 Malignant melanoma of skin, unspecified: Secondary | ICD-10-CM | POA: Diagnosis not present

## 2022-10-19 DIAGNOSIS — C439 Malignant melanoma of skin, unspecified: Secondary | ICD-10-CM | POA: Diagnosis not present

## 2022-11-18 DIAGNOSIS — C7801 Secondary malignant neoplasm of right lung: Secondary | ICD-10-CM | POA: Diagnosis not present

## 2022-11-18 DIAGNOSIS — C4359 Malignant melanoma of other part of trunk: Secondary | ICD-10-CM | POA: Diagnosis not present

## 2022-11-18 DIAGNOSIS — L270 Generalized skin eruption due to drugs and medicaments taken internally: Secondary | ICD-10-CM | POA: Diagnosis not present

## 2022-11-18 DIAGNOSIS — C439 Malignant melanoma of skin, unspecified: Secondary | ICD-10-CM | POA: Diagnosis not present

## 2022-11-18 DIAGNOSIS — L299 Pruritus, unspecified: Secondary | ICD-10-CM | POA: Diagnosis not present

## 2022-11-19 DIAGNOSIS — C439 Malignant melanoma of skin, unspecified: Secondary | ICD-10-CM | POA: Diagnosis not present

## 2022-12-16 DIAGNOSIS — C439 Malignant melanoma of skin, unspecified: Secondary | ICD-10-CM | POA: Diagnosis not present

## 2022-12-16 DIAGNOSIS — E032 Hypothyroidism due to medicaments and other exogenous substances: Secondary | ICD-10-CM | POA: Diagnosis not present

## 2022-12-16 DIAGNOSIS — T50905A Adverse effect of unspecified drugs, medicaments and biological substances, initial encounter: Secondary | ICD-10-CM | POA: Diagnosis not present

## 2022-12-21 DIAGNOSIS — C439 Malignant melanoma of skin, unspecified: Secondary | ICD-10-CM | POA: Diagnosis not present

## 2022-12-30 DIAGNOSIS — Z0000 Encounter for general adult medical examination without abnormal findings: Secondary | ICD-10-CM | POA: Diagnosis not present

## 2023-01-21 DIAGNOSIS — C439 Malignant melanoma of skin, unspecified: Secondary | ICD-10-CM | POA: Diagnosis not present

## 2023-02-02 DIAGNOSIS — H43812 Vitreous degeneration, left eye: Secondary | ICD-10-CM | POA: Diagnosis not present

## 2023-02-02 DIAGNOSIS — H2513 Age-related nuclear cataract, bilateral: Secondary | ICD-10-CM | POA: Diagnosis not present

## 2023-02-02 DIAGNOSIS — H31092 Other chorioretinal scars, left eye: Secondary | ICD-10-CM | POA: Diagnosis not present

## 2023-02-16 DIAGNOSIS — T50905A Adverse effect of unspecified drugs, medicaments and biological substances, initial encounter: Secondary | ICD-10-CM | POA: Diagnosis not present

## 2023-02-16 DIAGNOSIS — C4359 Malignant melanoma of other part of trunk: Secondary | ICD-10-CM | POA: Diagnosis not present

## 2023-02-16 DIAGNOSIS — R634 Abnormal weight loss: Secondary | ICD-10-CM | POA: Diagnosis not present

## 2023-02-16 DIAGNOSIS — L270 Generalized skin eruption due to drugs and medicaments taken internally: Secondary | ICD-10-CM | POA: Diagnosis not present

## 2023-02-16 DIAGNOSIS — C439 Malignant melanoma of skin, unspecified: Secondary | ICD-10-CM | POA: Diagnosis not present

## 2023-02-16 DIAGNOSIS — E032 Hypothyroidism due to medicaments and other exogenous substances: Secondary | ICD-10-CM | POA: Diagnosis not present

## 2023-02-16 DIAGNOSIS — L2989 Other pruritus: Secondary | ICD-10-CM | POA: Diagnosis not present

## 2023-02-16 DIAGNOSIS — Z9289 Personal history of other medical treatment: Secondary | ICD-10-CM | POA: Diagnosis not present

## 2023-02-22 DIAGNOSIS — C439 Malignant melanoma of skin, unspecified: Secondary | ICD-10-CM | POA: Diagnosis not present

## 2023-03-25 DIAGNOSIS — C439 Malignant melanoma of skin, unspecified: Secondary | ICD-10-CM | POA: Diagnosis not present

## 2023-04-26 DIAGNOSIS — C439 Malignant melanoma of skin, unspecified: Secondary | ICD-10-CM | POA: Diagnosis not present

## 2023-05-26 DIAGNOSIS — C439 Malignant melanoma of skin, unspecified: Secondary | ICD-10-CM | POA: Diagnosis not present

## 2023-05-26 DIAGNOSIS — C4359 Malignant melanoma of other part of trunk: Secondary | ICD-10-CM | POA: Diagnosis not present

## 2023-05-26 DIAGNOSIS — E032 Hypothyroidism due to medicaments and other exogenous substances: Secondary | ICD-10-CM | POA: Diagnosis not present

## 2023-05-27 DIAGNOSIS — C439 Malignant melanoma of skin, unspecified: Secondary | ICD-10-CM | POA: Diagnosis not present

## 2023-06-17 DIAGNOSIS — E032 Hypothyroidism due to medicaments and other exogenous substances: Secondary | ICD-10-CM | POA: Diagnosis not present

## 2023-06-17 DIAGNOSIS — C439 Malignant melanoma of skin, unspecified: Secondary | ICD-10-CM | POA: Diagnosis not present

## 2023-06-17 DIAGNOSIS — R634 Abnormal weight loss: Secondary | ICD-10-CM | POA: Diagnosis not present

## 2023-06-28 DIAGNOSIS — C439 Malignant melanoma of skin, unspecified: Secondary | ICD-10-CM | POA: Diagnosis not present

## 2023-06-30 DIAGNOSIS — Z85820 Personal history of malignant melanoma of skin: Secondary | ICD-10-CM | POA: Diagnosis not present

## 2023-06-30 DIAGNOSIS — L821 Other seborrheic keratosis: Secondary | ICD-10-CM | POA: Diagnosis not present

## 2023-06-30 DIAGNOSIS — L2989 Other pruritus: Secondary | ICD-10-CM | POA: Diagnosis not present

## 2023-06-30 DIAGNOSIS — Z85828 Personal history of other malignant neoplasm of skin: Secondary | ICD-10-CM | POA: Diagnosis not present

## 2023-07-29 DIAGNOSIS — C439 Malignant melanoma of skin, unspecified: Secondary | ICD-10-CM | POA: Diagnosis not present

## 2023-09-06 DIAGNOSIS — Z9289 Personal history of other medical treatment: Secondary | ICD-10-CM | POA: Diagnosis not present

## 2023-09-06 DIAGNOSIS — C4359 Malignant melanoma of other part of trunk: Secondary | ICD-10-CM | POA: Diagnosis not present

## 2023-09-06 DIAGNOSIS — R634 Abnormal weight loss: Secondary | ICD-10-CM | POA: Diagnosis not present

## 2023-09-06 DIAGNOSIS — E032 Hypothyroidism due to medicaments and other exogenous substances: Secondary | ICD-10-CM | POA: Diagnosis not present

## 2023-09-06 DIAGNOSIS — L299 Pruritus, unspecified: Secondary | ICD-10-CM | POA: Diagnosis not present

## 2023-09-06 DIAGNOSIS — C439 Malignant melanoma of skin, unspecified: Secondary | ICD-10-CM | POA: Diagnosis not present

## 2023-11-02 ENCOUNTER — Ambulatory Visit
Admission: RE | Admit: 2023-11-02 | Discharge: 2023-11-02 | Disposition: A | Discharge status: Home / Self Care | Source: Ambulatory Visit | Attending: General Surgery | Admitting: General Surgery

## 2023-11-02 ENCOUNTER — Other Ambulatory Visit: Payer: Self-pay | Admitting: General Surgery

## 2023-11-02 DIAGNOSIS — R1084 Generalized abdominal pain: Secondary | ICD-10-CM

## 2023-11-02 DIAGNOSIS — L821 Other seborrheic keratosis: Secondary | ICD-10-CM | POA: Diagnosis not present

## 2023-11-02 DIAGNOSIS — K56609 Unspecified intestinal obstruction, unspecified as to partial versus complete obstruction: Secondary | ICD-10-CM

## 2023-11-02 DIAGNOSIS — C4359 Malignant melanoma of other part of trunk: Secondary | ICD-10-CM

## 2023-11-02 DIAGNOSIS — Z85828 Personal history of other malignant neoplasm of skin: Secondary | ICD-10-CM | POA: Diagnosis not present

## 2023-11-02 DIAGNOSIS — K8020 Calculus of gallbladder without cholecystitis without obstruction: Secondary | ICD-10-CM | POA: Diagnosis not present

## 2023-11-02 DIAGNOSIS — C439 Malignant melanoma of skin, unspecified: Secondary | ICD-10-CM

## 2023-11-02 DIAGNOSIS — L812 Freckles: Secondary | ICD-10-CM | POA: Diagnosis not present

## 2023-11-02 DIAGNOSIS — Z85820 Personal history of malignant melanoma of skin: Secondary | ICD-10-CM | POA: Diagnosis not present

## 2023-11-02 MED ORDER — IOPAMIDOL (ISOVUE-300) INJECTION 61%
100.0000 mL | Freq: Once | INTRAVENOUS | Status: AC | PRN
  Administered 2023-11-02: 100 mL via INTRAVENOUS

## 2023-11-03 ENCOUNTER — Other Ambulatory Visit

## 2023-11-05 DIAGNOSIS — R1084 Generalized abdominal pain: Secondary | ICD-10-CM | POA: Diagnosis not present

## 2023-11-05 DIAGNOSIS — K5989 Other specified functional intestinal disorders: Secondary | ICD-10-CM | POA: Diagnosis not present

## 2023-11-05 DIAGNOSIS — K56609 Unspecified intestinal obstruction, unspecified as to partial versus complete obstruction: Secondary | ICD-10-CM | POA: Diagnosis not present

## 2023-11-05 DIAGNOSIS — C439 Malignant melanoma of skin, unspecified: Secondary | ICD-10-CM | POA: Diagnosis not present

## 2023-11-05 DIAGNOSIS — C4359 Malignant melanoma of other part of trunk: Secondary | ICD-10-CM | POA: Diagnosis not present

## 2023-11-24 DIAGNOSIS — E032 Hypothyroidism due to medicaments and other exogenous substances: Secondary | ICD-10-CM | POA: Diagnosis not present

## 2023-11-24 DIAGNOSIS — T50905A Adverse effect of unspecified drugs, medicaments and biological substances, initial encounter: Secondary | ICD-10-CM | POA: Diagnosis not present

## 2023-11-24 DIAGNOSIS — C439 Malignant melanoma of skin, unspecified: Secondary | ICD-10-CM | POA: Diagnosis not present

## 2023-11-26 DIAGNOSIS — C4359 Malignant melanoma of other part of trunk: Secondary | ICD-10-CM | POA: Diagnosis not present

## 2023-12-19 IMAGING — CT CT ABD-PELV W/ CM
2 of 5 series · 16 of 46 positions shown, 18 images · IV contrast (APPLIED)
Comparison: CT 03/16/2007

CLINICAL DATA: Abdominal pain, acute, nonlocalized

EXAM:
CT ABDOMEN AND PELVIS WITH CONTRAST
TECHNIQUE: Multidetector CT imaging of the abdomen and pelvis was performed
using the standard protocol following bolus administration of
intravenous contrast.

[Series 2: abd pel w · axial · 0.63mm/px · z∈[+762,+1112]mm · 13 of 80 slices shown, 15 images]
[im 5/80  soft-tissue]
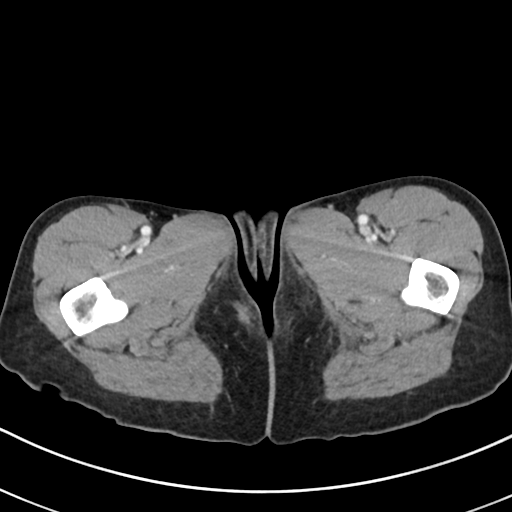
[im 5/80  bone]
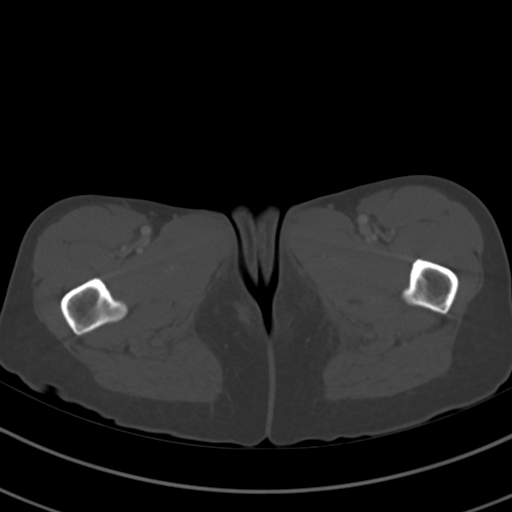
[im 13/80  soft-tissue]
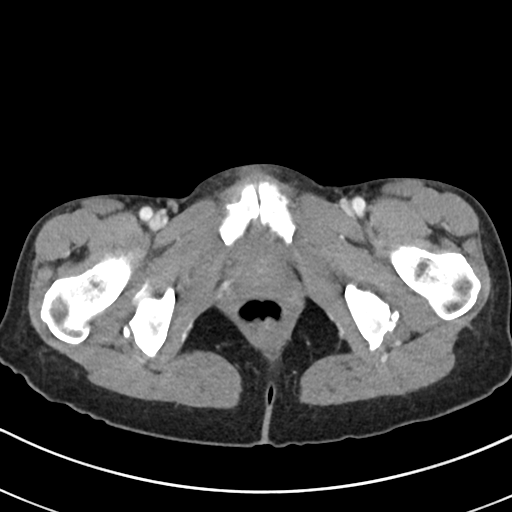
[im 17/80  soft-tissue]
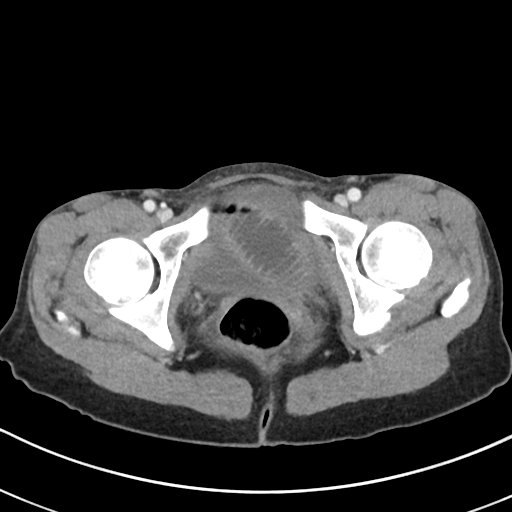
[im 21/80  soft-tissue]
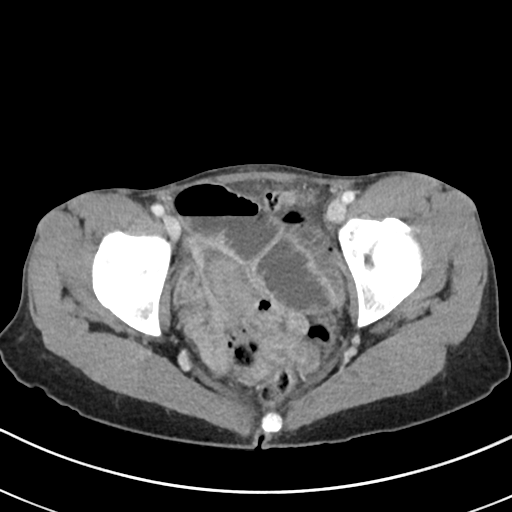
[im 30/80  soft-tissue]
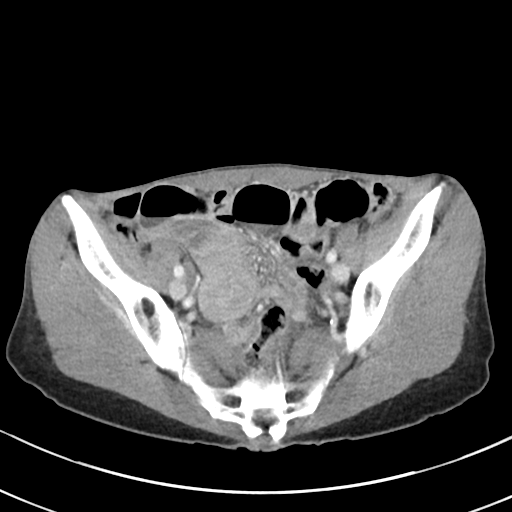
[im 34/80  soft-tissue]
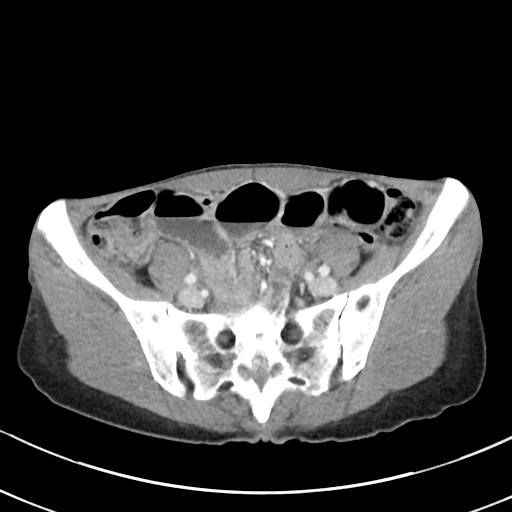
[im 42/80  soft-tissue]
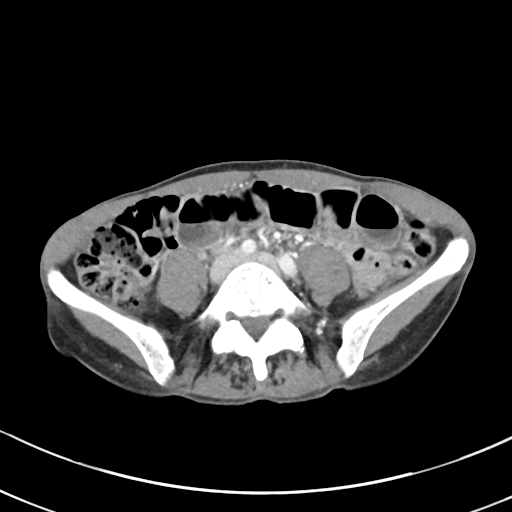
[im 46/80  soft-tissue]
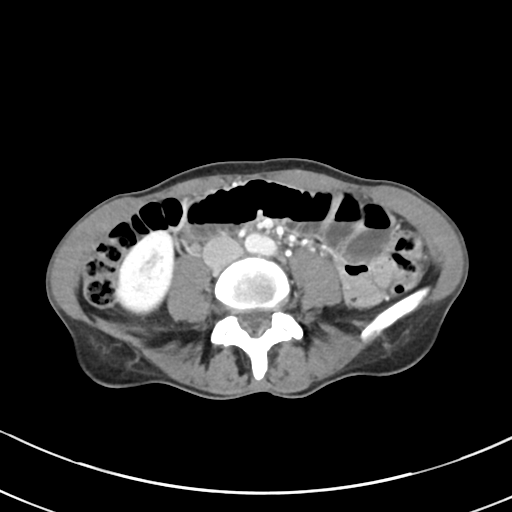
[im 50/80  soft-tissue]
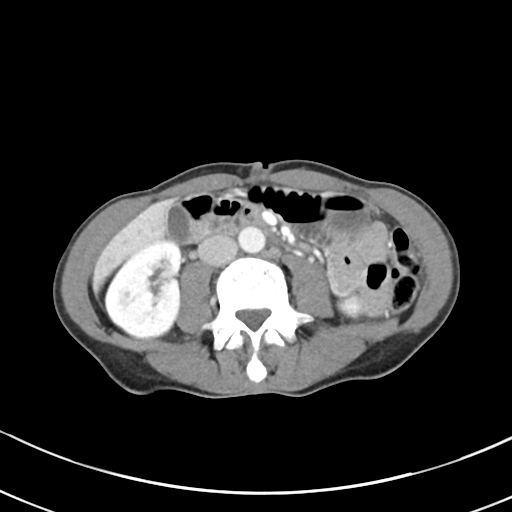
[im 50/80  bone]
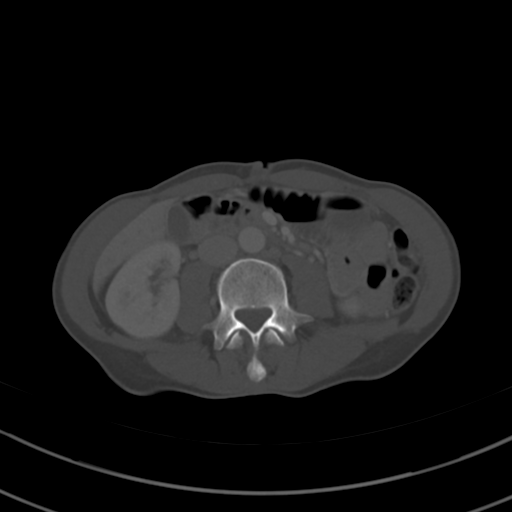
[im 59/80  soft-tissue]
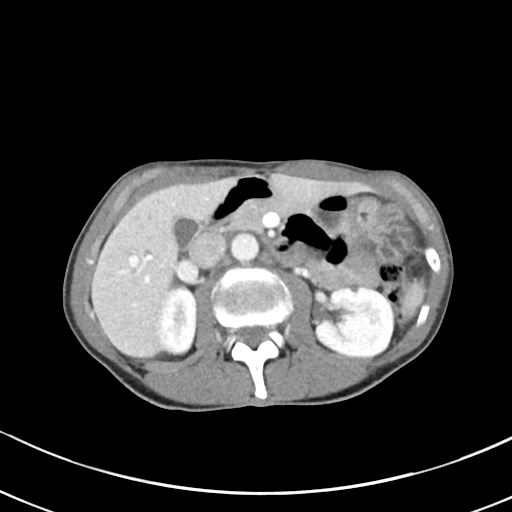
[im 63/80  soft-tissue]
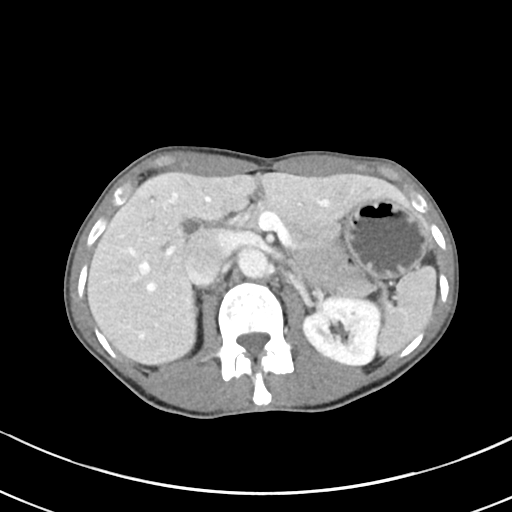
[im 67/80  soft-tissue]
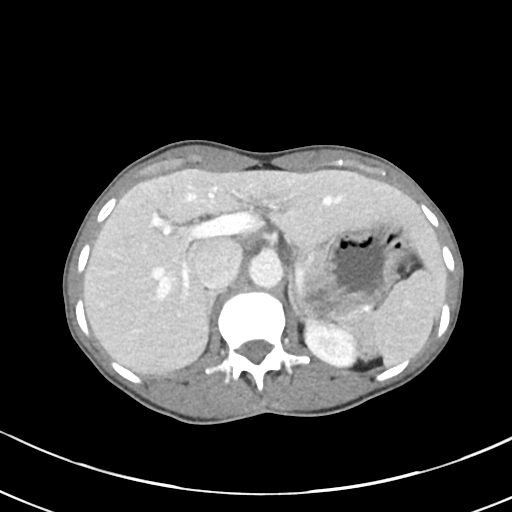
[im 75/80  soft-tissue]
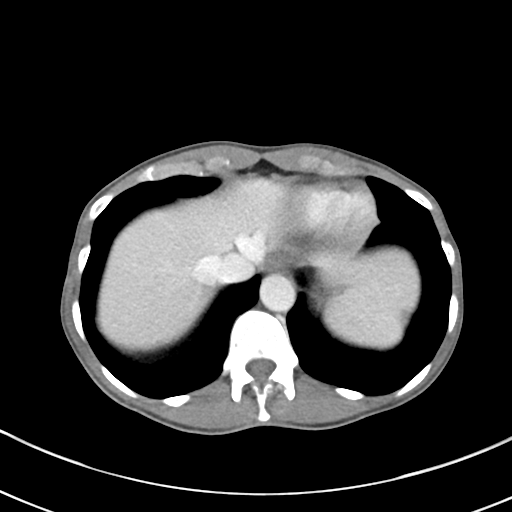

[Series 5: coronal · coronal · 0.66mm/px · 3 of 72 slices shown]
[im 24/72  soft-tissue]
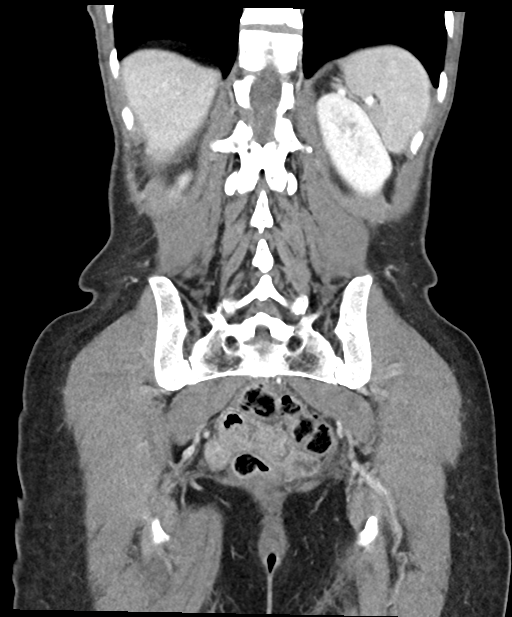
[im 32/72  soft-tissue]
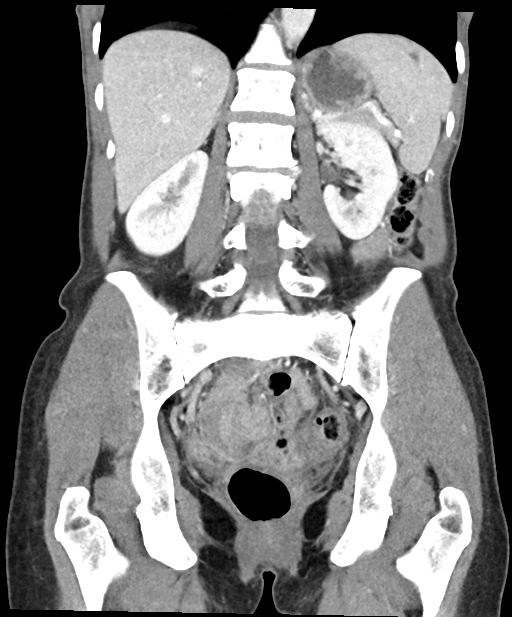
[im 40/72  soft-tissue]
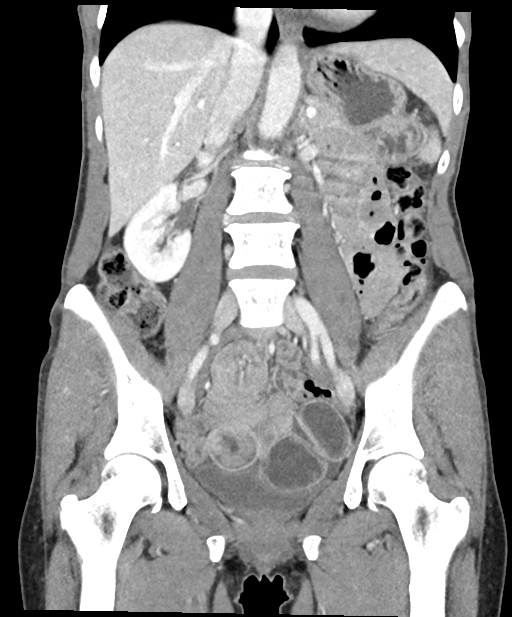

[16 of 46 positions shown; findings below may reference images not displayed]

RADIATION DOSE REDUCTION: This exam was performed according to the
departmental dose-optimization program which includes automated
exposure control, adjustment of the mA and/or kV according to
patient size and/or use of iterative reconstruction technique.

CONTRAST:  75mL OMNIPAQUE IOHEXOL 300 MG/ML  SOLN
FINDINGS: Lower chest: Partially imaged dense airspace consolidation in the
periphery of the right middle lobe (series 4, image 1). Normal heart
size.

Hepatobiliary: Small stone within the dependent aspect of the
gallbladder. No wall thickening or pericholecystic inflammatory
changes evident by CT. No focal liver lesion.

Pancreas: Unremarkable. No pancreatic ductal dilatation or
surrounding inflammatory changes.

Spleen: Normal in size without focal abnormality.

Adrenals/Urinary Tract: Adrenal glands are unremarkable. Kidneys are
normal, without renal calculi, focal lesion, or hydronephrosis.
Urinary bladder is decompressed.

Stomach/Bowel: Small bowel-small bowel intussusception within the
right lower quadrant (series 2, images 49-59). Small-bowel
obstruction upstream from the site of intussusception. Small bowel
loops dilated up to 4.3 cm with air-fluid levels. Colon is
decompressed. Stomach within normal limits.

Vascular/Lymphatic: No significant vascular findings are present. No
enlarged abdominal or pelvic lymph nodes.

Reproductive: Status post hysterectomy. No adnexal masses.

Other: No free fluid. No abdominopelvic fluid collection. No
pneumoperitoneum. No abdominal wall hernia.

Musculoskeletal: No acute or significant osseous findings.
IMPRESSION: 1. Small bowel-small bowel intussusception within the right lower
quadrant with associated small bowel obstruction upstream from the
site of intussusception.
2. Partially imaged dense airspace consolidation in the periphery of
the right middle lobe, suspicious for pneumonia. Radiographic
follow-up to resolution is recommended.
3. Cholelithiasis without evidence of acute cholecystitis.

## 2023-12-19 IMAGING — DX DG CHEST 1V PORT
1 series · 1 of 1 positions shown · non-contrast
Comparison: None.

CLINICAL DATA: Abnormal CT

EXAM:
PORTABLE CHEST 1 VIEW

[chest ap]
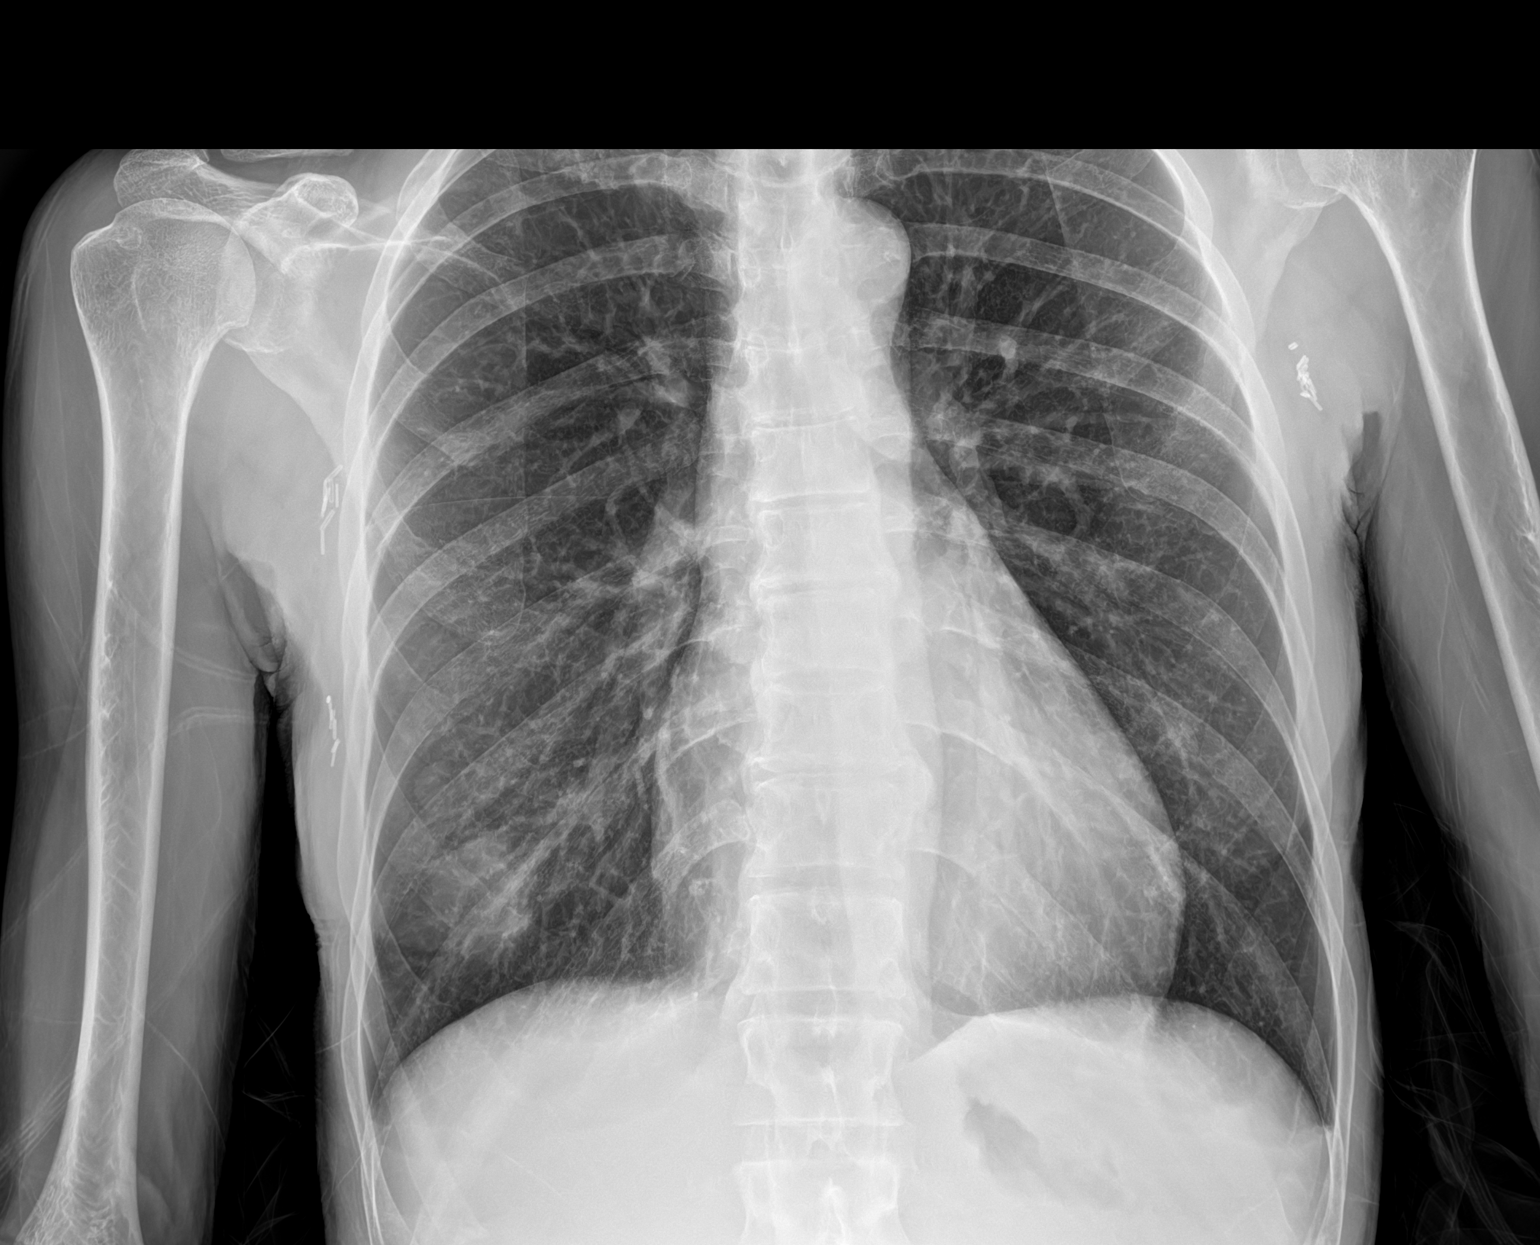

[1 of 1 positions shown; findings below may reference images not displayed]

FINDINGS: Cardiac and mediastinal contours are within normal limits. Right
middle lobe nodular opacity. Lungs otherwise clear. No pleural
effusion or pneumothorax.
IMPRESSION: Right middle nodular opacity which correlates with finding on same
day CT of the abdomen and pelvis. Recommend follow-up chest x-ray in
3-4 weeks to ensure resolution, if finding persists, CT of the chest
should be performed.

## 2023-12-27 DIAGNOSIS — K56609 Unspecified intestinal obstruction, unspecified as to partial versus complete obstruction: Secondary | ICD-10-CM | POA: Diagnosis not present

## 2023-12-27 DIAGNOSIS — C4359 Malignant melanoma of other part of trunk: Secondary | ICD-10-CM | POA: Diagnosis not present

## 2023-12-27 DIAGNOSIS — T50905D Adverse effect of unspecified drugs, medicaments and biological substances, subsequent encounter: Secondary | ICD-10-CM | POA: Diagnosis not present

## 2023-12-27 DIAGNOSIS — E032 Hypothyroidism due to medicaments and other exogenous substances: Secondary | ICD-10-CM | POA: Diagnosis not present

## 2023-12-27 DIAGNOSIS — L299 Pruritus, unspecified: Secondary | ICD-10-CM | POA: Diagnosis not present

## 2023-12-27 DIAGNOSIS — M7989 Other specified soft tissue disorders: Secondary | ICD-10-CM | POA: Diagnosis not present

## 2023-12-27 DIAGNOSIS — L659 Nonscarring hair loss, unspecified: Secondary | ICD-10-CM | POA: Diagnosis not present

## 2023-12-27 DIAGNOSIS — C439 Malignant melanoma of skin, unspecified: Secondary | ICD-10-CM | POA: Diagnosis not present

## 2023-12-27 DIAGNOSIS — Z08 Encounter for follow-up examination after completed treatment for malignant neoplasm: Secondary | ICD-10-CM | POA: Diagnosis not present

## 2023-12-27 DIAGNOSIS — Z9289 Personal history of other medical treatment: Secondary | ICD-10-CM | POA: Diagnosis not present

## 2023-12-27 DIAGNOSIS — Z85820 Personal history of malignant melanoma of skin: Secondary | ICD-10-CM | POA: Diagnosis not present

## 2023-12-28 DIAGNOSIS — C4359 Malignant melanoma of other part of trunk: Secondary | ICD-10-CM | POA: Diagnosis not present

## 2023-12-28 DIAGNOSIS — Z9289 Personal history of other medical treatment: Secondary | ICD-10-CM | POA: Diagnosis not present

## 2024-01-25 DIAGNOSIS — Z0000 Encounter for general adult medical examination without abnormal findings: Secondary | ICD-10-CM | POA: Diagnosis not present

## 2024-01-25 DIAGNOSIS — E039 Hypothyroidism, unspecified: Secondary | ICD-10-CM | POA: Diagnosis not present

## 2024-01-25 DIAGNOSIS — L209 Atopic dermatitis, unspecified: Secondary | ICD-10-CM | POA: Diagnosis not present

## 2024-01-25 DIAGNOSIS — E785 Hyperlipidemia, unspecified: Secondary | ICD-10-CM | POA: Diagnosis not present

## 2024-02-14 DIAGNOSIS — H2513 Age-related nuclear cataract, bilateral: Secondary | ICD-10-CM | POA: Diagnosis not present

## 2024-02-14 DIAGNOSIS — H43812 Vitreous degeneration, left eye: Secondary | ICD-10-CM | POA: Diagnosis not present

## 2024-02-14 DIAGNOSIS — H31092 Other chorioretinal scars, left eye: Secondary | ICD-10-CM | POA: Diagnosis not present
